# Patient Record
Sex: Female | Born: 1980 | Race: White | Hispanic: No | Marital: Married | Smoking: Former smoker
Health system: Southern US, Community
[De-identification: ages and names within clinical notes are randomized; demographics above are authoritative.]

## PROBLEM LIST (undated history)

## (undated) DIAGNOSIS — E669 Obesity, unspecified: Secondary | ICD-10-CM

## (undated) DIAGNOSIS — M797 Fibromyalgia: Secondary | ICD-10-CM

## (undated) DIAGNOSIS — M109 Gout, unspecified: Secondary | ICD-10-CM

## (undated) DIAGNOSIS — F419 Anxiety disorder, unspecified: Secondary | ICD-10-CM

## (undated) DIAGNOSIS — D509 Iron deficiency anemia, unspecified: Secondary | ICD-10-CM

## (undated) DIAGNOSIS — E039 Hypothyroidism, unspecified: Secondary | ICD-10-CM

## (undated) DIAGNOSIS — Z22322 Carrier or suspected carrier of Methicillin resistant Staphylococcus aureus: Secondary | ICD-10-CM

## (undated) DIAGNOSIS — M545 Low back pain, unspecified: Secondary | ICD-10-CM

## (undated) DIAGNOSIS — D6862 Lupus anticoagulant syndrome: Secondary | ICD-10-CM

## (undated) DIAGNOSIS — E559 Vitamin D deficiency, unspecified: Secondary | ICD-10-CM

## (undated) DIAGNOSIS — F319 Bipolar disorder, unspecified: Secondary | ICD-10-CM

## (undated) DIAGNOSIS — F429 Obsessive-compulsive disorder, unspecified: Secondary | ICD-10-CM

## (undated) DIAGNOSIS — F4310 Post-traumatic stress disorder, unspecified: Secondary | ICD-10-CM

## (undated) DIAGNOSIS — K219 Gastro-esophageal reflux disease without esophagitis: Secondary | ICD-10-CM

## (undated) DIAGNOSIS — I10 Essential (primary) hypertension: Secondary | ICD-10-CM

## (undated) DIAGNOSIS — N185 Chronic kidney disease, stage 5: Secondary | ICD-10-CM

## (undated) DIAGNOSIS — Z87898 Personal history of other specified conditions: Secondary | ICD-10-CM

## (undated) DIAGNOSIS — G43909 Migraine, unspecified, not intractable, without status migrainosus: Secondary | ICD-10-CM

## (undated) DIAGNOSIS — E282 Polycystic ovarian syndrome: Secondary | ICD-10-CM

## (undated) HISTORY — PX: BARIATRIC SURGERY: SHX1103

## (undated) HISTORY — DX: Gastro-esophageal reflux disease without esophagitis: K21.9

## (undated) HISTORY — DX: Obsessive-compulsive disorder, unspecified: F42.9

## (undated) HISTORY — DX: Anxiety disorder, unspecified: F41.9

## (undated) HISTORY — DX: Personal history of other specified conditions: Z87.898

## (undated) HISTORY — PX: OTHER SURGICAL HISTORY: SHX169

## (undated) HISTORY — DX: Low back pain: M54.5

## (undated) HISTORY — DX: Lupus anticoagulant syndrome: D68.62

## (undated) HISTORY — DX: Essential (primary) hypertension: I10

## (undated) HISTORY — DX: Low back pain, unspecified: M54.50

## (undated) HISTORY — DX: Carrier or suspected carrier of methicillin resistant Staphylococcus aureus: Z22.322

## (undated) HISTORY — DX: Post-traumatic stress disorder, unspecified: F43.10

## (undated) HISTORY — DX: Bipolar disorder, unspecified: F31.9

## (undated) HISTORY — DX: Migraine, unspecified, not intractable, without status migrainosus: G43.909

## (undated) HISTORY — DX: Vitamin D deficiency, unspecified: E55.9

## (undated) HISTORY — DX: Chronic kidney disease, stage 5: N18.5

## (undated) HISTORY — DX: Polycystic ovarian syndrome: E28.2

## (undated) HISTORY — DX: Obesity, unspecified: E66.9

## (undated) HISTORY — DX: Fibromyalgia: M79.7

## (undated) HISTORY — DX: Gout, unspecified: M10.9

## (undated) HISTORY — PX: AV FISTULA PLACEMENT: SHX1204

## (undated) HISTORY — DX: Iron deficiency anemia, unspecified: D50.9

## (undated) HISTORY — DX: Hypothyroidism, unspecified: E03.9

## (undated) HISTORY — PX: RENAL BIOPSY: SHX156

---

## 2002-03-05 DIAGNOSIS — I10 Essential (primary) hypertension: Secondary | ICD-10-CM | POA: Insufficient documentation

## 2009-07-16 DIAGNOSIS — F419 Anxiety disorder, unspecified: Secondary | ICD-10-CM | POA: Insufficient documentation

## 2009-07-16 DIAGNOSIS — J45909 Unspecified asthma, uncomplicated: Secondary | ICD-10-CM | POA: Insufficient documentation

## 2009-07-16 DIAGNOSIS — F32A Depression, unspecified: Secondary | ICD-10-CM | POA: Insufficient documentation

## 2011-04-12 DIAGNOSIS — N96 Recurrent pregnancy loss: Secondary | ICD-10-CM | POA: Insufficient documentation

## 2012-01-30 DIAGNOSIS — R768 Other specified abnormal immunological findings in serum: Secondary | ICD-10-CM | POA: Insufficient documentation

## 2012-01-30 DIAGNOSIS — R760 Raised antibody titer: Secondary | ICD-10-CM | POA: Insufficient documentation

## 2016-05-03 DIAGNOSIS — N028 Recurrent and persistent hematuria with other morphologic changes: Secondary | ICD-10-CM | POA: Insufficient documentation

## 2016-05-03 DIAGNOSIS — R809 Proteinuria, unspecified: Secondary | ICD-10-CM | POA: Insufficient documentation

## 2017-01-26 DIAGNOSIS — I770 Arteriovenous fistula, acquired: Secondary | ICD-10-CM | POA: Insufficient documentation

## 2017-12-15 ENCOUNTER — Ambulatory Visit: Payer: Managed Care, Other (non HMO) | Admitting: Family

## 2017-12-15 ENCOUNTER — Encounter: Payer: Self-pay | Admitting: Family

## 2017-12-15 VITALS — BP 120/86 | HR 79 | Temp 97.0°F | Ht 64.0 in | Wt 214.6 lb

## 2017-12-15 DIAGNOSIS — E282 Polycystic ovarian syndrome: Secondary | ICD-10-CM

## 2017-12-15 DIAGNOSIS — D509 Iron deficiency anemia, unspecified: Secondary | ICD-10-CM

## 2017-12-15 DIAGNOSIS — M797 Fibromyalgia: Secondary | ICD-10-CM

## 2017-12-15 DIAGNOSIS — N185 Chronic kidney disease, stage 5: Secondary | ICD-10-CM

## 2017-12-15 DIAGNOSIS — Z9884 Bariatric surgery status: Secondary | ICD-10-CM | POA: Insufficient documentation

## 2017-12-15 DIAGNOSIS — G40909 Epilepsy, unspecified, not intractable, without status epilepticus: Secondary | ICD-10-CM | POA: Insufficient documentation

## 2017-12-15 DIAGNOSIS — M545 Low back pain, unspecified: Secondary | ICD-10-CM | POA: Insufficient documentation

## 2017-12-15 DIAGNOSIS — F3181 Bipolar II disorder: Secondary | ICD-10-CM

## 2017-12-15 DIAGNOSIS — M1A39X0 Chronic gout due to renal impairment, multiple sites, without tophus (tophi): Secondary | ICD-10-CM

## 2017-12-15 DIAGNOSIS — E559 Vitamin D deficiency, unspecified: Secondary | ICD-10-CM

## 2017-12-15 DIAGNOSIS — I2510 Atherosclerotic heart disease of native coronary artery without angina pectoris: Secondary | ICD-10-CM

## 2017-12-15 DIAGNOSIS — E039 Hypothyroidism, unspecified: Secondary | ICD-10-CM

## 2017-12-15 DIAGNOSIS — G8929 Other chronic pain: Secondary | ICD-10-CM

## 2017-12-15 DIAGNOSIS — R55 Syncope and collapse: Secondary | ICD-10-CM

## 2017-12-15 DIAGNOSIS — I10 Essential (primary) hypertension: Secondary | ICD-10-CM

## 2017-12-15 DIAGNOSIS — F5101 Primary insomnia: Secondary | ICD-10-CM

## 2017-12-15 DIAGNOSIS — G4700 Insomnia, unspecified: Secondary | ICD-10-CM | POA: Insufficient documentation

## 2017-12-15 DIAGNOSIS — K219 Gastro-esophageal reflux disease without esophagitis: Secondary | ICD-10-CM | POA: Diagnosis not present

## 2017-12-15 DIAGNOSIS — F429 Obsessive-compulsive disorder, unspecified: Secondary | ICD-10-CM

## 2017-12-15 DIAGNOSIS — M109 Gout, unspecified: Secondary | ICD-10-CM | POA: Insufficient documentation

## 2017-12-15 DIAGNOSIS — F4310 Post-traumatic stress disorder, unspecified: Secondary | ICD-10-CM

## 2017-12-15 DIAGNOSIS — Z8739 Personal history of other diseases of the musculoskeletal system and connective tissue: Secondary | ICD-10-CM | POA: Insufficient documentation

## 2017-12-15 DIAGNOSIS — E538 Deficiency of other specified B group vitamins: Secondary | ICD-10-CM

## 2017-12-15 NOTE — Patient Instructions (Signed)
Chronic Kidney Disease, Adult Chronic kidney disease (CKD) happens when the kidneys are damaged during a time of 3 or more months. The kidneys are two organs that do many important jobs in the body. These jobs include:  Removing wastes and extra fluids from the blood.  Making hormones that maintain the amount of fluid in your tissues and blood vessels.  Making sure that the body has the right amount of fluids and chemicals.  Most of the time, this condition does not go away, but it can usually be controlled. Steps must be taken to slow down the kidney damage or stop it from getting worse. Otherwise, the kidneys may stop working. Follow these instructions at home:  Follow your diet as told by your doctor. You may need to avoid alcohol, salty foods (sodium), and foods that are high in potassium, calcium, and protein.  Take over-the-counter and prescription medicines only as told by your doctor. Do not take any new medicines unless your doctor says you can do that. These include vitamins and minerals. ? Medicines and nutritional supplements can make kidney damage worse. ? Your doctor may need to change how much medicine you take.  Do not use any tobacco products. These include cigarettes, chewing tobacco, and e-cigarettes. If you need help quitting, ask your doctor.  Keep all follow-up visits as told by your doctor. This is important.  Check your blood pressure. Tell your doctor if there are changes to your blood pressure.  Get to a healthy weight. Stay at that weight. If you need help with this, ask your doctor.  Start or continue an exercise plan. Try to exercise at least 30 minutes a day, 5 days a week.  Stay up-to-date with your shots (immunizations) as told by your doctor. Contact a doctor if:  Your symptoms get worse.  You have new symptoms. Get help right away if:  You have symptoms of end-stage kidney disease. These include: ? Headaches. ? Skin that is darker or lighter  than normal. ? Numbness in your hands or feet. ? Easy bruising. ? Having hiccups often. ? Chest pain. ? Shortness of breath. ? Stopping of menstrual periods in women.  You have a fever.  You are making very little pee (urine).  You have pain or bleeding when you pee (urinate). This information is not intended to replace advice given to you by your health care provider. Make sure you discuss any questions you have with your health care provider. Document Released: 12/21/2009 Document Revised: 03/03/2016 Document Reviewed: 05/25/2012 Elsevier Interactive Patient Education  2017 Elsevier Inc.  

## 2017-12-15 NOTE — Progress Notes (Signed)
Subjective:    Patient ID: Erica Burns, female    DOB: [birth year], 37 y.o.   MRN: [id number]  Pt presents to the office today to establish care. PT recently moved to this area about two weeks from [place]. We do not have her records from her previous PCP. Pt states she has several chronic problems: has Anxiety; Depression; Hypertension; Vitamin D deficiency; CAD (coronary artery disease); Iron deficiency anemia; Insomnia; Hypothyroidism; Low back pain; GERD (gastroesophageal reflux disease); Hx of bariatric surgery; Bipolar 2 disorder (Erica Burns); CKD (chronic kidney disease), stage V (Erica Burns); PCOS (polycystic ovarian syndrome); PTSD (post-traumatic stress disorder); Fibromyalgia; Seizure disorder (Erica Burns); OCD (obsessive compulsive disorder); Gout; and Hx of degenerative disc disease on their problem list..  Pt is currently not seeing any specialists at this time and requesting several referrals today. She has seen a Cardiologists, [employer], Nephrologists, and Neurologists,  Rheumatologists, and a Holiday representative in [place] before moving.   PT had an AV fistula placed December 12 2016, but never needed dialysis. States she had bariatric surgery last year and states her "kidneys stabilized after losing weight".   Hypertension  This is a chronic problem. The current episode started more than 1 year ago. The problem has been resolved since onset. The problem is controlled. Associated symptoms include malaise/fatigue. Pertinent negatives include no chest pain, headaches, peripheral edema or shortness of breath. Risk factors for coronary artery disease include dyslipidemia, obesity and sedentary lifestyle. The current treatment provides moderate improvement. Hypertensive end-organ damage includes kidney disease and CAD/MI. Identifiable causes of hypertension include a thyroid problem.  Depression         This is a chronic problem.  The current episode started more than 1 year ago.   The onset quality is gradual.    The problem occurs intermittently.  The problem has been waxing and waning since onset.  Associated symptoms include fatigue, helplessness, hopelessness, irritable, restlessness, decreased interest and sad.  Associated symptoms include no headaches.  Compliance with treatment is good.  Risk factors include major life event.   Past medical history includes thyroid problem.   Thyroid Problem  Presents for follow-up visit. Symptoms include constipation and fatigue. Patient reports no diarrhea or dry skin. The symptoms have been stable.  Seizures   This is a chronic problem. The current episode started more than 1 week ago. The problem has been gradually worsening. Pertinent negatives include no headaches, no chest pain and no diarrhea.  Anemia  Presents for follow-up visit. Symptoms include malaise/fatigue.  Back Pain  This is a chronic problem. The current episode started more than 1 year ago. The problem occurs intermittently. The problem has been waxing and waning since onset. The quality of the pain is described as aching. The pain is at a severity of 5/10. The pain is moderate. Pertinent negatives include no chest pain, dysuria, headaches or leg pain. She has tried analgesics and NSAIDs for the symptoms. The treatment provided mild relief.      Review of Systems  Constitutional: Positive for fatigue and malaise/fatigue.  Respiratory: Negative for shortness of breath.   Cardiovascular: Negative for chest pain.  Gastrointestinal: Positive for constipation. Negative for diarrhea.  Genitourinary: Negative for dysuria.  Musculoskeletal: Positive for back pain.  Neurological: Positive for seizures. Negative for headaches.  Psychiatric/Behavioral: Positive for depression.  All other systems reviewed and are negative.  Family History  Problem Relation Age of Onset  . Hypertension Mother   . Hypothyroidism Mother   . Hypertension Father   .  Hyperthyroidism Father     Social History    Socioeconomic History  . Marital status: Married    Spouse name: None  . Number of children: None  . Years of education: None  . Highest education level: None  Social Needs  . Financial resource strain: None  . Food insecurity - worry: None  . Food insecurity - inability: None  . Transportation needs - medical: None  . Transportation needs - non-medical: None  Occupational History  . None  Tobacco Use  . Smoking status: Current Every Day Smoker    Types: Cigarettes  . Smokeless tobacco: Never Used  Substance and Sexual Activity  . Alcohol use: No    Frequency: Never  . Drug use: No  . Sexual activity: None  Other Topics Concern  . None  Social History Narrative  . None        Objective:   Physical Exam  Constitutional: She is oriented to person, place, and time. She appears well-developed and well-nourished. She is irritable. No distress.  HENT:  Head: Normocephalic and atraumatic.  Right Ear: External ear normal.  Left Ear: External ear normal.  Nose: Nose normal.  Mouth/Throat: Oropharynx is clear and moist.  Eyes: Pupils are equal, round, and reactive to light.  Neck: Normal range of motion. Neck supple. No thyromegaly present.  Cardiovascular: Normal rate, regular rhythm, normal heart sounds and intact distal pulses.  No murmur heard. Pulmonary/Chest: Effort normal and breath sounds normal. No respiratory distress. She has no wheezes.  Abdominal: Soft. Bowel sounds are normal. She exhibits no distension. There is no tenderness.  Musculoskeletal: Normal range of motion. She exhibits no edema or tenderness.  Neurological: She is alert and oriented to person, place, and time.  Skin: Skin is warm and dry.  Psychiatric: She has a normal mood and affect. Her behavior is normal. Judgment and thought content normal.  Vitals reviewed.    BP 120/86   Pulse 79   Temp (!) 97 F (36.1 C) (Oral)   Ht 5' 4"  (1.626 m)   Wt 214 lb 9.6 oz (97.3 kg)   BMI 36.84 kg/m       Assessment & Plan:  1. Vitamin D deficiency - CMP14+EGFR - VITAMIN D 25 Hydroxy (Vit-D Deficiency, Fractures)  2. Coronary artery disease involving native heart without angina pectoris, unspecified vessel or lesion type - Ambulatory referral to Cardiology - CMP14+EGFR  3. Iron deficiency anemia, unspecified iron deficiency anemia type - Anemia Profile B - CMP14+EGFR  4. Primary insomnia - Ambulatory referral to Psychiatry - CMP14+EGFR  5. Hypothyroidism, unspecified type - CMP14+EGFR - TSH  6. Chronic bilateral low back pain without sciatica - CMP14+EGFR  7. Gastroesophageal reflux disease, esophagitis presence not specified - CMP14+EGFR  8. Hx of bariatric surgery - CMP14+EGFR  9. Bipolar 2 disorder ([place])  - Ambulatory referral to Psychiatry - CMP14+EGFR  10. Essential hypertension - CMP14+EGFR  11. CKD (chronic kidney disease), stage V ([place]) - Ambulatory referral to Nephrology - CMP14+EGFR  12. PCOS (polycystic ovarian syndrome)  - CMP14+EGFR  13. PTSD (post-traumatic stress disorder) - Ambulatory referral to Psychiatry - CMP14+EGFR  14. Fibromyalgia  - Ambulatory referral to Psychiatry - Ambulatory referral to Neurology - CMP14+EGFR  15. Seizure disorder [facility]) - Ambulatory referral to Neurology - CMP14+EGFR  16. Obsessive-compulsive disorder, unspecified type  - Ambulatory referral to Psychiatry - CMP14+EGFR  17. Chronic gout due to renal impairment of multiple sites without tophus  - CMP14+EGFR  18. Hx of degenerative  disc disease  - CMP14+EGFR  19. Syncope, unspecified syncope type - Ambulatory referral to Neurology - CMP14+EGFR  20. Vitamin B 12 deficiency    Continue all meds Labs pending Health Maintenance reviewed- Pt states she had her Pap last year, but can not recall name of place. Will call us with name so we can get records faxed and scanned into chart.  Diet and exercise encouraged RTO 3 months   Evelina Dun, FNP

## 2017-12-16 LAB — VITAMIN D 25 HYDROXY (VIT D DEFICIENCY, FRACTURES): VIT D 25 HYDROXY: 43.3 ng/mL (ref 30.0–100.0)

## 2017-12-16 LAB — CMP14+EGFR
A/G RATIO: 1.4 (ref 1.2–2.2)
ALBUMIN: 3.7 g/dL (ref 3.5–5.5)
ALK PHOS: 70 IU/L (ref 39–117)
ALT: 14 IU/L (ref 0–32)
AST: 10 IU/L (ref 0–40)
BILIRUBIN TOTAL: 0.3 mg/dL (ref 0.0–1.2)
BUN / CREAT RATIO: 10 (ref 9–23)
BUN: 40 mg/dL — ABNORMAL HIGH (ref 6–20)
CO2: 23 mmol/L (ref 20–29)
Calcium: 9.2 mg/dL (ref 8.7–10.2)
Chloride: 107 mmol/L — ABNORMAL HIGH (ref 96–106)
Creatinine, Ser: 3.86 mg/dL (ref 0.57–1.00)
GFR calc Af Amer: 16 mL/min/{1.73_m2} — ABNORMAL LOW (ref >59)
GFR calc non Af Amer: 14 mL/min/{1.73_m2} — ABNORMAL LOW (ref >59)
GLOBULIN, TOTAL: 2.7 g/dL (ref 1.5–4.5)
Glucose: 79 mg/dL (ref 65–99)
POTASSIUM: 5 mmol/L (ref 3.5–5.2)
Sodium: 145 mmol/L — ABNORMAL HIGH (ref 134–144)
Total Protein: 6.4 g/dL (ref 6.0–8.5)

## 2017-12-16 LAB — ANEMIA PROFILE B
BASOS: 1 %
Basophils Absolute: 0.1 10*3/uL (ref 0.0–0.2)
EOS (ABSOLUTE): 0.4 10*3/uL (ref 0.0–0.4)
EOS: 5 %
FERRITIN: 27 ng/mL (ref 15–150)
Folate: 5.1 ng/mL (ref >3.0)
HEMATOCRIT: 43.9 % (ref 34.0–46.6)
HEMOGLOBIN: 14.1 g/dL (ref 11.1–15.9)
IMMATURE GRANS (ABS): 0 10*3/uL (ref 0.0–0.1)
IRON SATURATION: 22 % (ref 15–55)
Immature Granulocytes: 0 %
Iron: 69 ug/dL (ref 27–159)
LYMPHS ABS: 2.3 10*3/uL (ref 0.7–3.1)
Lymphs: 31 %
MCH: 28.9 pg (ref 26.6–33.0)
MCHC: 32.1 g/dL (ref 31.5–35.7)
MCV: 90 fL (ref 79–97)
MONOS ABS: 0.5 10*3/uL (ref 0.1–0.9)
Monocytes: 6 %
Neutrophils Absolute: 4.2 10*3/uL (ref 1.4–7.0)
Neutrophils: 57 %
Platelets: 257 10*3/uL (ref 150–379)
RBC: 4.88 x10E6/uL (ref 3.77–5.28)
RDW: 15.8 % — ABNORMAL HIGH (ref 12.3–15.4)
Retic Ct Pct: 1.2 % (ref 0.6–2.6)
Total Iron Binding Capacity: 311 ug/dL (ref 250–450)
UIBC: 242 ug/dL (ref 131–425)
Vitamin B-12: 685 pg/mL (ref 232–1245)
WBC: 7.4 10*3/uL (ref 3.4–10.8)

## 2017-12-16 LAB — TSH: TSH: 4.5 u[IU]/mL (ref 0.450–4.500)

## 2017-12-26 ENCOUNTER — Ambulatory Visit: Payer: Managed Care, Other (non HMO) | Admitting: Family

## 2018-01-04 ENCOUNTER — Ambulatory Visit: Payer: Managed Care, Other (non HMO) | Admitting: Cardiology

## 2018-01-18 ENCOUNTER — Encounter: Payer: Self-pay | Admitting: Nurse Practitioner

## 2018-01-18 ENCOUNTER — Ambulatory Visit: Payer: Managed Care, Other (non HMO) | Admitting: Nurse Practitioner

## 2018-01-18 VITALS — BP 125/78 | HR 100 | Temp 97.8°F | Ht 64.0 in | Wt 215.0 lb

## 2018-01-18 DIAGNOSIS — J40 Bronchitis, not specified as acute or chronic: Secondary | ICD-10-CM

## 2018-01-18 MED ORDER — HYDROCODONE-HOMATROPINE 5-1.5 MG/5ML PO SYRP: 5.0000 mL | ORAL_SOLUTION | Freq: Four times a day (QID) | ORAL | 0 refills | Status: DC | PRN

## 2018-01-18 MED ORDER — AZITHROMYCIN 250 MG PO TABS: ORAL_TABLET | ORAL | 0 refills | Status: DC

## 2018-01-18 MED ORDER — ALBUTEROL SULFATE HFA 108 (90 BASE) MCG/ACT IN AERS: 2.0000 | INHALATION_SPRAY | Freq: Four times a day (QID) | RESPIRATORY_TRACT | 0 refills | Status: DC | PRN

## 2018-01-18 MED ORDER — PREDNISONE 10 MG (21) PO TBPK: ORAL_TABLET | ORAL | 0 refills | Status: DC

## 2018-01-18 NOTE — Progress Notes (Signed)
   Subjective:    Patient ID: Erica Burns, female    DOB: [birth year], 37 y.o.   MRN: [id number]  HPI patient comes in c/o cough for over 2 weeks. Has gotten worse. Hurts to cough. Difficult to take a deep breath.    Review of Systems  Constitutional: Negative for chills and fever.  HENT: Positive for congestion. Negative for ear pain, sinus pressure, sinus pain, sore throat and trouble swallowing.   Respiratory: Positive for cough and shortness of breath.   Cardiovascular: Negative.   Neurological: Negative.   Psychiatric/Behavioral: Negative.   All other systems reviewed and are negative.      Objective:   Physical Exam  Constitutional: She is oriented to person, place, and time. She appears well-developed and well-nourished. No distress.  HENT:  Right Ear: Hearing, tympanic membrane, external ear and ear canal normal.  Left Ear: Hearing, tympanic membrane, external ear and ear canal normal.  Nose: Mucosal edema and rhinorrhea present. Right sinus exhibits no maxillary sinus tenderness and no frontal sinus tenderness. Left sinus exhibits no maxillary sinus tenderness and no frontal sinus tenderness.  Mouth/Throat: Uvula is midline, oropharynx is clear and moist and mucous membranes are normal.  Neck: Normal range of motion. Neck supple.  Cardiovascular: Normal rate and regular rhythm.  Pulmonary/Chest: Effort normal. No respiratory distress. She has wheezes (exp wheezes throughout with deep wet cough).  Neurological: She is alert and oriented to person, place, and time.  Skin: Skin is warm.  Psychiatric: She has a normal mood and affect. Her behavior is normal. Judgment and thought content normal.   BP 125/78   Pulse 100   Temp 97.8 F (36.6 C) (Oral)   Ht 5\' 4"  (1.626 m)   Wt 215 lb (97.5 kg)   BMI 36.90 kg/m         Assessment & Plan:   1. Bronchitis    Smoking cessation encouraged Meds ordered this encounter  Medications  . azithromycin (ZITHROMAX Z-PAK) 250  MG tablet    Sig: As directed    Dispense:  6 tablet    Refill:  0    Order Specific Question:   Supervising Provider    Answer:   VINCENT, CAROL L [[id number]]  . HYDROcodone-homatropine (HYCODAN) 5-1.5 MG/5ML syrup    Sig: Take 5 mLs by mouth every 6 (six) hours as needed for cough.    Dispense:  120 mL    Refill:  0    Order Specific Question:   Supervising Provider    Answer:   VINCENT, CAROL L [[id number]]  . predniSONE (STERAPRED UNI-PAK 21 TAB) 10 MG (21) TBPK tablet    Sig: As directed x 6 days    Dispense:  21 tablet    Refill:  0    Order Specific Question:   Supervising Provider    Answer:   VINCENT, CAROL L [[id number]]  . albuterol (PROVENTIL HFA;VENTOLIN HFA) 108 (90 Base) MCG/ACT inhaler    Sig: Inhale 2 puffs into the lungs every 6 (six) hours as needed for wheezing or shortness of breath.    Dispense:  1 Inhaler    Refill:  0    Order Specific Question:   Supervising Provider    Answer:   Johna SheriffVINCENT, CAROL L [[id number]]   Rest RTO prn  Mary-Margaret Daphine DeutscherMartin, FNP

## 2018-01-18 NOTE — Addendum Note (Signed)
Addended by: Bennie PieriniMARTIN, MARY-MARGARET on: 01/18/2018 10:56 AM   Modules accepted: Orders

## 2018-01-18 NOTE — Patient Instructions (Signed)

## 2018-01-23 ENCOUNTER — Encounter: Payer: Self-pay | Admitting: Cardiology

## 2018-01-23 NOTE — Progress Notes (Signed)
Cardiology Office Note  Date: 01/24/2018   ID: Erica Burns, DOB 11-09-1980, MRN [id number]  PCP: Junie SpencerHawks, Christy A, FNP  Consulting Cardiologist: Nona DellSamuel Estoria Geary, MD   Chief Complaint  Patient presents with  . Cardiac evaluation    History of Present Illness: Erica Burns is a 37 y.o. female referred for cardiology consultation by Ms. Hawks NP due to reported history of CAD. I reviewed available records and updated her chart.  She is here today with significant other.  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  She tells me that she had some type of "cardiac event" in December 2018, was hospitalized in [place] with chest tightness following an episode of intense lower back pain.  She recalls being told that she did not have a heart attack, and since that time has not had any recurring symptoms.  She also states that she had a cardiac workup prior to bariatric surgery in 2017 and does not recall any specific concerns.  I reviewed her medications.  She states that she has been on Cardizem CD and clonidine for blood pressure control.  She states that her blood pressure fluctuates from time to time.  She does not report any exertional chest pain, palpitations, orthopnea, dizziness, or syncope.  She does have chronic intermittent leg swelling, particularly when she is sedentary.  I personally reviewed her ECG today which shows normal sinus rhythm.  She is in the process of establishing with a nephrologist, has known history of CKD stage 5 with AV fistula in place, although has not had to undergo hemodialysis.   Past Medical History:  Diagnosis Date  . Anxiety   . Bipolar disorder (HCC)   . CKD (chronic kidney disease) stage 5, GFR less than 15 ml/min (HCC)   . Essential hypertension   . Fibromyalgia   . GERD (gastroesophageal reflux disease)   . Gout   . History of seizures    Single episode reportedly secondary to Imitrex  . Hypothyroidism   .  Iron deficiency anemia   . Low back pain   . Lupus anticoagulant syndrome (HCC)   . Migraine headache   . MRSA carrier   . Obesity   . OCD (obsessive compulsive disorder)   . PCOS (polycystic ovarian syndrome)   . PTSD (post-traumatic stress disorder)   . Vitamin D deficiency     Past Surgical History:  Procedure Laterality Date  . Arthoscopic knee surgery    . AV FISTULA PLACEMENT    . BARIATRIC SURGERY    . Cyst removal    . RENAL BIOPSY      Current Outpatient Medications  Medication Sig Dispense Refill  . albuterol (PROVENTIL HFA;VENTOLIN HFA) 108 (90 Base) MCG/ACT inhaler Inhale 2 puffs into the lungs every 6 (six) hours as needed for wheezing or shortness of breath. 1 Inhaler 0  . Cholecalciferol (HM VITAMIN D3) 4000 units CAPS Take by mouth.    . cloNIDine (CATAPRES) 0.1 MG tablet     . diltiazem (CARDIZEM CD) 180 MG 24 hr capsule     . ferrous sulfate 325 (65 FE) MG tablet Take by mouth.    Marland Kitchen. HYDROcodone-homatropine (HYCODAN) 5-1.5 MG/5ML syrup Take 5 mLs by mouth every 6 (six) hours as needed for cough. 120 mL 0  . hydrOXYzine (ATARAX/VISTARIL) 25 MG tablet     . levothyroxine (SYNTHROID, LEVOTHROID) 50 MCG tablet     . lidocaine (LIDODERM) 5 %     .  pantoprazole (PROTONIX) 40 MG tablet     . promethazine (PHENERGAN) 25 MG tablet TAKE 1 TABLET BY MOUTH TWICE A DAY AS NEEDED FOR MOTION SICKNESS  0  . traZODone (DESYREL) 100 MG tablet     . ziprasidone (GEODON) 20 MG capsule Take 20 mg by mouth 2 (two) times daily.  0   No current facility-administered medications for this visit.    Allergies:  Imitrex [sumatriptan]; Latex; and Nsaids   Social History: The patient  reports that she has been smoking cigarettes.  She has been smoking about 0.25 packs per day. She has never used smokeless tobacco. She reports that she does not drink alcohol or use drugs.   Family History: The patient's family history includes Hyperlipidemia in her father and mother; Hypertension in her  father and mother; Kidney Stones in her father.   ROS:  Please see the history of present illness. Otherwise, complete review of systems is positive for mild intermittent leg swelling.  All other systems are reviewed and negative.   Physical Exam: VS:  BP (!) 140/98   Pulse 91   Ht 5\' 4"  (1.626 m)   Wt 220 lb (99.8 kg)   SpO2 97%   BMI 37.76 kg/m , BMI Body mass index is 37.76 kg/m.  Wt Readings from Last 3 Encounters:  01/24/18 220 lb (99.8 kg)  01/18/18 215 lb (97.5 kg)  12/15/17 214 lb 9.6 oz (97.3 kg)    General: Patient appears comfortable at rest. HEENT: Conjunctiva and lids normal, oropharynx clear. Neck: Supple, no elevated JVP or carotid bruits, no thyromegaly. Lungs: Clear to auscultation, nonlabored breathing at rest. Cardiac: Regular rate and rhythm, no S3 or significant systolic murmur, no pericardial rub. Abdomen: Soft, nontender, bowel sounds present. Extremities: No pitting edema, distal pulses 2+.  AV fistula left forearm with thrill. Skin: Warm and dry. Musculoskeletal: No kyphosis. Neuropsychiatric: Alert and oriented x3, affect grossly appropriate.  ECG: There is no old tracing available for comparison.  Recent Labwork: 12/15/2017: ALT 14; AST 10; BUN 40; Creatinine, Ser 3.86; Hemoglobin 14.1; Platelets 257; Potassium 5.0; Sodium 145; TSH 4.500   Assessment and Plan:  1.  Patient presents for cardiac evaluation with reported history of chest pain and a hospital stay in [place] back in December 2018.  At this time I do not have records to review objective workup, but she is under the impression that she did not have a heart attack or any other major abnormalities uncovered at that point.  Her ECG today is normal.  She is not reporting recurring chest pain.  Plan at this time is to try and obtain records from cardiac workup prior to bariatric surgery in 2017, and also hospital stay from 2018.  2.  Essential hypertension, currently on clonidine and Cardizem  CD.  She has recently established with [facility] follow-up.  3.  CKD stage 5, recent creatinine 3.86.  She has a left forearm AV fistula that has not been accessed as yet.  She has not undergone hemodialysis.  Currently in the process of establishing with a nephrologist.  4.  Tobacco abuse.  Current medicines were reviewed with the patient today.   Orders Placed This Encounter  Procedures  . EKG 12-Lead    Disposition: Obtain outside records.  Signed, Jonelle Sidle, MD, [facility] 01/24/2018 9:23 AM    [facility] at [facility] [address] [place], [place], [place] [address] Phone: [phone]; Fax: [phone]

## 2018-01-24 ENCOUNTER — Encounter: Payer: Self-pay | Admitting: *Deleted

## 2018-01-24 ENCOUNTER — Ambulatory Visit: Payer: Managed Care, Other (non HMO) | Admitting: Cardiology

## 2018-01-24 ENCOUNTER — Encounter: Payer: Self-pay | Admitting: Cardiology

## 2018-01-24 VITALS — BP 140/98 | HR 91 | Ht 64.0 in | Wt 220.0 lb

## 2018-01-24 DIAGNOSIS — N185 Chronic kidney disease, stage 5: Secondary | ICD-10-CM

## 2018-01-24 DIAGNOSIS — Z720 Tobacco use: Secondary | ICD-10-CM

## 2018-01-24 DIAGNOSIS — I10 Essential (primary) hypertension: Secondary | ICD-10-CM | POA: Diagnosis not present

## 2018-01-24 DIAGNOSIS — Z87898 Personal history of other specified conditions: Secondary | ICD-10-CM | POA: Diagnosis not present

## 2018-01-24 NOTE — Patient Instructions (Signed)
Your physician recommends that you schedule a follow-up appointment in: TO BE DETERMINED WITH DR MCDOWELL   Your physician recommends that you continue on your current medications as directed. Please refer to the Current Medication list given to you today.  Thank you for choosing [facility]!!

## 2018-01-24 NOTE — Progress Notes (Signed)
Records received from [facility] in [place] regarding hospital stay late November to early January 2018.  She presented at that time with what was described as epigastric and chest discomfort as well as shortness of breath.  Morphine improved symptoms.  Troponin levels were negative for ACS.  She underwent a Lexiscan Myoview on December 1 which did not demonstrate any significant ST segment changes and revealed no evidence of ischemia with normal LVEF.  Echocardiogram also done at that time reported LVEF 55 to 60%, normal right ventricular contraction, mild mitral regurgitation, mild tricuspid regurgitation, and PASP 35-40 mmHg.  ECG showed sinus rhythm with decreased R wave progression and low voltage in the precordial leads.

## 2018-01-30 ENCOUNTER — Ambulatory Visit (HOSPITAL_COMMUNITY): Payer: Self-pay | Admitting: Psychiatry

## 2018-02-05 ENCOUNTER — Encounter: Payer: Self-pay | Admitting: Family

## 2018-02-05 ENCOUNTER — Ambulatory Visit: Payer: Managed Care, Other (non HMO) | Admitting: Family

## 2018-02-05 VITALS — BP 146/106 | HR 85 | Temp 97.7°F | Ht 64.0 in | Wt 220.4 lb

## 2018-02-05 DIAGNOSIS — F332 Major depressive disorder, recurrent severe without psychotic features: Secondary | ICD-10-CM | POA: Diagnosis not present

## 2018-02-05 DIAGNOSIS — F3181 Bipolar II disorder: Secondary | ICD-10-CM | POA: Diagnosis not present

## 2018-02-05 DIAGNOSIS — F429 Obsessive-compulsive disorder, unspecified: Secondary | ICD-10-CM

## 2018-02-05 DIAGNOSIS — F419 Anxiety disorder, unspecified: Secondary | ICD-10-CM

## 2018-02-05 MED ORDER — ZIPRASIDONE HCL 40 MG PO CAPS: 40.0000 mg | ORAL_CAPSULE | Freq: Two times a day (BID) | ORAL | 1 refills | Status: DC

## 2018-02-05 NOTE — Progress Notes (Signed)
Subjective:    Patient ID: Erica Burns, female    DOB: 06-23-1981, 37 y.o.   MRN: [id number]  PT presents to the office today for increased GAD, Depression, PTSD, OCD, and Bipoloar 2. PT had psychiatrists in [place], but has not seen behavorial health since. She has an appt on 02/22/18. States she see's a Quarry manager weekly but feels like this is not helping at this time.   Depression         This is a chronic problem.  The current episode started more than 1 year ago.   The onset quality is gradual.   The problem occurs constantly.  Associated symptoms include decreased concentration, helplessness, hopelessness, irritable, restlessness, decreased interest and sad.  Associated symptoms include no suicidal ideas.     The symptoms are aggravated by family issues.  Past medical history includes anxiety.   Anxiety  Presents for follow-up visit. Symptoms include decreased concentration, excessive worry, irritability, palpitations and restlessness. Patient reports no suicidal ideas. Symptoms occur most days. The severity of symptoms is severe. The quality of sleep is good.        Review of Systems  Constitutional: Positive for irritability.  Cardiovascular: Positive for palpitations.  Psychiatric/Behavioral: Positive for decreased concentration and depression. Negative for suicidal ideas.  All other systems reviewed and are negative.      Objective:   Physical Exam  Constitutional: She appears well-developed and well-nourished. She is irritable. No distress.  HENT:  Head: Normocephalic and atraumatic.  Right Ear: External ear normal.  Mouth/Throat: Oropharynx is clear and moist.  Eyes: Pupils are equal, round, and reactive to light.  Neck: Normal range of motion. Neck supple. No thyromegaly present.  Cardiovascular: Normal rate, regular rhythm, normal heart sounds and intact distal pulses.  No murmur heard. Pulmonary/Chest: Effort normal. No respiratory distress. She has wheezes.    Abdominal: Soft. Bowel sounds are normal. She exhibits no distension. There is no tenderness.  Musculoskeletal: Normal range of motion. She exhibits no edema or tenderness.  Neurological: She is alert. She has normal reflexes.  Skin: Skin is warm and dry.  Psychiatric: Thought content normal. Her mood appears anxious. She is hyperactive. She expresses inappropriate judgment. She expresses no suicidal ideation. She is inattentive.  Vitals reviewed.    BP (!) 146/106   Pulse 85   Temp 97.7 F (36.5 C) (Oral)   Ht  (1.626 m)   Wt 220 lb 6.4 oz (100 kg)   BMI 37.83 kg/m      Assessment & Plan:  1. Anxiety - ziprasidone (GEODON) 40 MG capsule; Take 1 capsule (40 mg total) by mouth 2 (two) times daily with a meal.  Dispense: 60 capsule; Refill: 1  2. Severe episode of recurrent major depressive disorder, without psychotic features (HCC) - ziprasidone (GEODON) 40 MG capsule; Take 1 capsule (40 mg total) by mouth 2 (two) times daily with a meal.  Dispense: 60 capsule; Refill: 1  3. Bipolar 2 disorder (HCC)  - ziprasidone (GEODON) 40 MG capsule; Take 1 capsule (40 mg total) by mouth 2 (two) times daily with a meal.  Dispense: 60 capsule; Refill: 1  4. Obsessive-compulsive disorder, unspecified type - ziprasidone (GEODON) 40 MG capsule; Take 1 capsule (40 mg total) by mouth 2 (two) times daily with a meal.  Dispense: 60 capsule; Refill: 1  Will increase Geodon to BID instead of daily Called [facility] and sent in to Urgent Care for patient to be seen today. Her referral to [facility]  was pushed back to next month If patient has any SI or HI to go to ED   Jannifer Rodney, FNP

## 2018-02-05 NOTE — Patient Instructions (Signed)
Bipolar 2 Disorder Bipolar 2 disorder is a mental health disorder in which a person has episodes of emotional highs (mania) and lows (depression). Bipolar 2 is different from other bipolar disorders because the manic episodes are not as high and do not last as long. This is called hypomania. People with bipolar 2 disorder usually go back and forth between hypomanic and depressive episodes. What are the causes? The cause of this condition is not known. What increases the risk? The following factors may make you more likely to develop this condition:  Having a family member with the disorder.  An imbalance of certain chemicals in the brain (neurotransmitters).  Stress, such as a death, illness, or financial problems.  Certain conditions that affect the brain or spinal cord (neurologic conditions).  Brain injury (trauma).  Having another mental health disorder, such as: ? Obsessive compulsive disorder. ? Schizophrenia.  What are the signs or symptoms? Symptoms of hypomania include:  Very high self-esteem or self-confidence.  Decreased need for sleep.  Unusual talkativeness or feeling a need to keep talking. Speech may be very fast. It may seem like you cannot stop talking.  Racing thoughts or constant talking, with quick shifts between topics that may or may not be related (flight of ideas).  Decreased ability to focus or concentrate.  Increased purposeful activity, such as work, studies, or social activity.  Increased nonproductive activity. This could be pacing, squirming and fidgeting, or finger and toe tapping.  Impulsive behavior and poor judgment. This may result in high-risk activities, such as having unprotected sex or spending a lot of money.  Symptoms of depression include:  Feeling sad, hopeless, or helpless.  Frequent or uncontrollable crying.  Lack of feeling or caring about anything.  Sleeping too much.  Moving more slowly than usual.  Not being able to  enjoy things you used to enjoy.  Desire to be alone all the time.  Feeling guilty or worthless.  Lack of energy or motivation.  Trouble concentrating or remembering.  Trouble making decisions.  Increased appetite.  Thoughts of death or desire to harm yourself.  How is this diagnosed? To diagnose bipolar 2 disorder, your health care provider may ask about your:  Emotional episodes.  Medical history.  Alcohol and drug use. This includes prescription medicines. Certain medical conditions and substances can cause symptoms that seem like bipolar disorder (secondary bipolar disorder).  How is this treated? Bipolar 2 disorder is a long-term (chronic) illness. It is best controlled with ongoing (continuous) treatment rather than being treated only when symptoms occur. Treatment may include:  Psychotherapy. Some forms of talk therapy, such as cognitive-behavioral therapy (CBT), can provide support, education, and guidance.  Coping strategies, such as journaling or relaxation exercises. Relaxation exercises include: ? Yoga. ? Meditation. ? Deep breathing.  Lifestyle changes, such as: ? Limiting alcohol and drug use. ? Exercising regularly. ? Getting plenty of sleep. ? Making healthy eating choices.  Medicine. Medicine can be prescribed by a health care provider who specializes in treating mental disorders (psychiatrist). ? Medicines called mood stabilizers are usually prescribed. ? If symptoms occur even while taking a mood stabilizer, other medicines may be added.  A combination of medicine, talk therapy, and coping methods is the best way to treat this condition. Follow these instructions at home: Activity  Return to your normal activities as told by your health care provider.  Find activities that you enjoy, and make time to do them.  Exercise regularly as told by your health   care provider. Lifestyle  Limit alcohol intake to no more than 1 drink a day for nonpregnant  women and 2 drinks a day for men. One drink equals 12 oz of beer, 5 oz of wine, or 1 oz of hard liquor.  Follow a set schedule for eating and sleeping.  Eat a balanced diet that includes fresh fruits and vegetables, whole grains, low-fat dairy, and lean meats.  Get at least 7-8 hours of sleep each night. General instructions  Take over-the-counter and prescription medicines only as told by your health care provider.  Think about joining a support group. Your health care provider may be able to recommend a support group.  Talk with your family and loved ones about your treatment goals and how they can help.  Keep all follow-up visits as told by your health care provider. This is important. Where to find more information: For more information about bipolar 2 disorder, visit the following websites:  National Alliance on Mental Illness: www.nami.org  U.S. National Institute of Mental Health: www.nimh.nih.gov  Contact a health care provider if:  Your symptoms get worse.  You have side effects from your medicine, and they get worse.  You have trouble sleeping.  You have trouble doing daily activities.  You feel unsafe in your surroundings.  You are dealing with substance abuse. Get help right away if:  You have new symptoms.  You have thoughts about harming yourself or others.  You harm yourself. Summary  Bipolar 2 disorder is a mental health disorder in which a person has episodes of hypomania and depression.  Bipolar 2 is best treated through a combination of medicines, talk therapy, and coping strategies.  Talk with your family and loved ones about your treatment goals and how they can help. This information is not intended to replace advice given to you by your health care provider. Make sure you discuss any questions you have with your health care provider. Document Released: 11/01/2016 Document Revised: 11/01/2016 Document Reviewed: 11/01/2016 Elsevier Interactive  Patient Education  2018 Elsevier Inc.  

## 2018-02-14 ENCOUNTER — Other Ambulatory Visit: Payer: Self-pay | Admitting: Nurse Practitioner

## 2018-02-14 DIAGNOSIS — J40 Bronchitis, not specified as acute or chronic: Secondary | ICD-10-CM

## 2018-02-20 ENCOUNTER — Ambulatory Visit: Payer: Managed Care, Other (non HMO) | Admitting: Neurology

## 2018-02-21 ENCOUNTER — Encounter: Payer: Self-pay | Admitting: Neurology

## 2018-02-22 ENCOUNTER — Encounter

## 2018-02-22 ENCOUNTER — Encounter (HOSPITAL_COMMUNITY): Payer: Self-pay | Admitting: Psychiatry

## 2018-02-22 ENCOUNTER — Ambulatory Visit (INDEPENDENT_AMBULATORY_CARE_PROVIDER_SITE_OTHER): Payer: 59 | Admitting: Psychiatry

## 2018-02-22 VITALS — BP 112/78 | HR 90 | Ht 64.0 in | Wt 213.0 lb

## 2018-02-22 DIAGNOSIS — F419 Anxiety disorder, unspecified: Secondary | ICD-10-CM

## 2018-02-22 DIAGNOSIS — F332 Major depressive disorder, recurrent severe without psychotic features: Secondary | ICD-10-CM

## 2018-02-22 DIAGNOSIS — F4310 Post-traumatic stress disorder, unspecified: Secondary | ICD-10-CM

## 2018-02-22 DIAGNOSIS — F3181 Bipolar II disorder: Secondary | ICD-10-CM

## 2018-02-22 DIAGNOSIS — F429 Obsessive-compulsive disorder, unspecified: Secondary | ICD-10-CM

## 2018-02-22 MED ORDER — BUSPIRONE HCL 5 MG PO TABS: 5.0000 mg | ORAL_TABLET | Freq: Two times a day (BID) | ORAL | 0 refills | Status: DC

## 2018-02-22 MED ORDER — TRAZODONE HCL 100 MG PO TABS: ORAL_TABLET | ORAL | 0 refills | Status: DC

## 2018-02-22 MED ORDER — ZIPRASIDONE HCL 60 MG PO CAPS: 60.0000 mg | ORAL_CAPSULE | Freq: Every day | ORAL | 0 refills | Status: DC
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
## 2018-02-23 ENCOUNTER — Ambulatory Visit (INDEPENDENT_AMBULATORY_CARE_PROVIDER_SITE_OTHER): Payer: Managed Care, Other (non HMO) | Admitting: Nurse Practitioner

## 2018-02-23 ENCOUNTER — Encounter: Payer: Self-pay | Admitting: Nurse Practitioner

## 2018-02-23 ENCOUNTER — Ambulatory Visit (INDEPENDENT_AMBULATORY_CARE_PROVIDER_SITE_OTHER): Payer: Managed Care, Other (non HMO)

## 2018-02-23 VITALS — BP 130/88 | HR 89 | Temp 97.5°F | Ht 64.0 in | Wt 212.0 lb

## 2018-02-23 DIAGNOSIS — R05 Cough: Secondary | ICD-10-CM

## 2018-02-23 DIAGNOSIS — R059 Cough, unspecified: Secondary | ICD-10-CM

## 2018-02-23 DIAGNOSIS — J209 Acute bronchitis, unspecified: Secondary | ICD-10-CM

## 2018-02-23 MED ORDER — HYDROCODONE-HOMATROPINE 5-1.5 MG/5ML PO SYRP: 5.0000 mL | ORAL_SOLUTION | Freq: Four times a day (QID) | ORAL | 0 refills | Status: DC | PRN

## 2018-02-23 MED ORDER — AMOXICILLIN 875 MG PO TABS: 875.0000 mg | ORAL_TABLET | Freq: Two times a day (BID) | ORAL | 0 refills | Status: DC

## 2018-02-23 MED ORDER — PREDNISONE 10 MG (21) PO TBPK: ORAL_TABLET | ORAL | 0 refills | Status: DC

## 2018-02-23 MED ORDER — AZITHROMYCIN 250 MG PO TABS: ORAL_TABLET | ORAL | 0 refills | Status: DC

## 2018-02-23 NOTE — Progress Notes (Addendum)
   Subjective:    Patient ID: Erica Burns, female    DOB: 04-03-1981, 37 y.o.   MRN: [id number]   Chief Complaint: Cough   HPI Patient come sin today c/o cough that started 2 weeks. She has used OTC cough meds and used her albuterol inhaler. She is having SOB.     Review of Systems  Constitutional: Negative for chills and fever.  HENT: Positive for congestion. Negative for ear pain, rhinorrhea, sinus pressure, sinus pain, sore throat and trouble swallowing.   Respiratory: Positive for cough, shortness of breath and wheezing.   Cardiovascular: Negative.   Gastrointestinal: Negative.   Genitourinary: Negative.   Neurological: Negative.   Psychiatric/Behavioral: Negative.   All other systems reviewed and are negative.      Objective:   Physical Exam  Constitutional: She is oriented to person, place, and time. She appears well-developed and well-nourished. She appears distressed (mild).  HENT:  Right Ear: Hearing, tympanic membrane, external ear and ear canal normal.  Left Ear: Hearing, tympanic membrane, external ear and ear canal normal.  Nose: Mucosal edema and rhinorrhea present.  Mouth/Throat: Oropharynx is clear and moist and mucous membranes are normal.  Neck: Normal range of motion. Neck supple.  Pulmonary/Chest: Effort normal. She has wheezes (exp wheezes tyhroughout).  Deep raspy cough  Lymphadenopathy:    She has no cervical adenopathy.  Neurological: She is alert and oriented to person, place, and time. No cranial nerve deficit.  Skin: Skin is warm.  Psychiatric: She has a normal mood and affect. Her behavior is normal. Judgment and thought content normal.  Nursing note and vitals reviewed.   BP 130/88   Pulse 89   Temp (!) 97.5 F (36.4 C) (Oral)   Ht  (1.626 m)   Wt 212 lb (96.2 kg)   BMI 36.39 kg/m '  Chest xray- no pneumonia- mild bronchitic changes-Preliminary reading by Paulene Floor, FNP  [facility]     Assessment & Plan:  Erica Burns in today  with chief complaint of Cough   1. Cough - DG Chest 2 View; Future  2. Acute bronchitis, unspecified organism Force fluids Rest Sedation precautions with coughmeds Follow up prn - azithromycin (ZITHROMAX Z-PAK) 250 MG tablet; As directed  Dispense: 6 tablet; Refill: 0 - predniSONE (STERAPRED UNI-PAK 21 TAB) 10 MG (21) TBPK tablet; As directed x 6 days  Dispense: 21 tablet; Refill: 0 - HYDROcodone-homatropine (HYCODAN) 5-1.5 MG/5ML syrup; Take 5 mLs by mouth every 6 (six) hours as needed for cough.  Dispense: 120 mL; Refill: 0  Mary-Margaret Daphine Deutscher, FNP  D/c'd z pak- changed to amoxicillin 875 bid x 10 days #20

## 2018-02-23 NOTE — Patient Instructions (Signed)

## 2018-02-23 NOTE — Addendum Note (Signed)
Addended by: Bennie Pierini on: 02/23/2018 04:12 PM   Modules accepted: Orders

## 2018-02-28 ENCOUNTER — Other Ambulatory Visit: Payer: Self-pay | Admitting: *Deleted

## 2018-02-28 DIAGNOSIS — R059 Cough, unspecified: Secondary | ICD-10-CM

## 2018-02-28 DIAGNOSIS — R05 Cough: Secondary | ICD-10-CM

## 2018-03-01 ENCOUNTER — Telehealth: Payer: Self-pay | Admitting: Family

## 2018-03-02 NOTE — Telephone Encounter (Signed)
Letter sent with appointment date/time 

## 2018-03-06 ENCOUNTER — Telehealth: Payer: Self-pay | Admitting: Family

## 2018-03-06 NOTE — Telephone Encounter (Signed)
Discussed this with our Radiologists. She will call and get order changed. I do not want to cancel appointment and lose her appointment time.

## 2018-03-07 ENCOUNTER — Other Ambulatory Visit: Payer: Managed Care, Other (non HMO)

## 2018-03-18 ENCOUNTER — Other Ambulatory Visit (HOSPITAL_COMMUNITY): Payer: Self-pay | Admitting: Psychiatry

## 2018-03-18 DIAGNOSIS — F3181 Bipolar II disorder: Secondary | ICD-10-CM

## 2018-03-18 DIAGNOSIS — F419 Anxiety disorder, unspecified: Secondary | ICD-10-CM

## 2018-03-19 ENCOUNTER — Encounter: Payer: Self-pay | Admitting: Family

## 2018-03-19 ENCOUNTER — Ambulatory Visit (INDEPENDENT_AMBULATORY_CARE_PROVIDER_SITE_OTHER): Payer: Managed Care, Other (non HMO) | Admitting: Family

## 2018-03-19 VITALS — BP 109/78 | HR 80 | Temp 97.7°F | Ht 64.0 in | Wt 222.6 lb

## 2018-03-19 DIAGNOSIS — J45909 Unspecified asthma, uncomplicated: Secondary | ICD-10-CM

## 2018-03-19 DIAGNOSIS — I2510 Atherosclerotic heart disease of native coronary artery without angina pectoris: Secondary | ICD-10-CM | POA: Diagnosis not present

## 2018-03-19 DIAGNOSIS — I10 Essential (primary) hypertension: Secondary | ICD-10-CM | POA: Diagnosis not present

## 2018-03-19 DIAGNOSIS — K219 Gastro-esophageal reflux disease without esophagitis: Secondary | ICD-10-CM

## 2018-03-19 DIAGNOSIS — G40909 Epilepsy, unspecified, not intractable, without status epilepticus: Secondary | ICD-10-CM | POA: Diagnosis not present

## 2018-03-19 DIAGNOSIS — F419 Anxiety disorder, unspecified: Secondary | ICD-10-CM | POA: Diagnosis not present

## 2018-03-19 DIAGNOSIS — E039 Hypothyroidism, unspecified: Secondary | ICD-10-CM | POA: Diagnosis not present

## 2018-03-19 DIAGNOSIS — F4310 Post-traumatic stress disorder, unspecified: Secondary | ICD-10-CM

## 2018-03-19 DIAGNOSIS — N185 Chronic kidney disease, stage 5: Secondary | ICD-10-CM

## 2018-03-19 DIAGNOSIS — D509 Iron deficiency anemia, unspecified: Secondary | ICD-10-CM

## 2018-03-19 DIAGNOSIS — F332 Major depressive disorder, recurrent severe without psychotic features: Secondary | ICD-10-CM

## 2018-03-19 DIAGNOSIS — Z23 Encounter for immunization: Secondary | ICD-10-CM

## 2018-03-19 MED ORDER — PROMETHAZINE HCL 25 MG PO TABS: ORAL_TABLET | ORAL | 5 refills | Status: AC

## 2018-03-19 MED ORDER — LEVOTHYROXINE SODIUM 50 MCG PO TABS: 50.0000 ug | ORAL_TABLET | Freq: Every day | ORAL | 1 refills | Status: AC

## 2018-03-19 MED ORDER — CLONIDINE HCL 0.1 MG PO TABS: 0.1000 mg | ORAL_TABLET | Freq: Two times a day (BID) | ORAL | 3 refills | Status: AC

## 2018-03-19 MED ORDER — BECLOMETHASONE DIPROP HFA 40 MCG/ACT IN AERB: 2.0000 | INHALATION_SPRAY | Freq: Two times a day (BID) | RESPIRATORY_TRACT | 6 refills | Status: AC

## 2018-03-19 MED ORDER — DILTIAZEM HCL ER COATED BEADS 180 MG PO CP24: 180.0000 mg | ORAL_CAPSULE | Freq: Every day | ORAL | 1 refills | Status: AC

## 2018-03-19 MED ORDER — TRAZODONE HCL 100 MG PO TABS: ORAL_TABLET | ORAL | 0 refills | Status: DC

## 2018-03-19 NOTE — Progress Notes (Signed)
Subjective:    Patient ID: Erica Burns, female    DOB: [birth year], 37 y.o.   MRN: [id number]  Chief Complaint  Patient presents with  . Depression    three month recheck  . Hypertension  . Hypothyroidism   PT presents to the office today for chronic follow up. PT is followed by behavorial health monthly for  GAD, Depression, PTSD, OCD, and Bipoloar 2. And followed by nephrologists every 6 weeks for CKD. She is scheduled for neurologists for seizure.     Hypertension  This is a chronic problem. The current episode started more than 1 year ago. The problem has been resolved since onset. The problem is controlled. Associated symptoms include anxiety and malaise/fatigue. Pertinent negatives include no headaches, peripheral edema or shortness of breath. Risk factors for coronary artery disease include obesity and sedentary lifestyle. The current treatment provides moderate improvement. Hypertensive end-organ damage includes CAD/MI. There is no history of CVA or heart failure. Identifiable causes of hypertension include a thyroid problem.  Anxiety  Presents for follow-up visit. Symptoms include depressed mood, excessive worry, insomnia, nervous/anxious behavior and restlessness. Patient reports no shortness of breath. Symptoms occur constantly. The severity of symptoms is moderate.   Her past medical history is significant for anemia and asthma.  Depression         Associated symptoms include fatigue, insomnia and restlessness.  Associated symptoms include no headaches.  Past medical history includes thyroid problem and anxiety.   Gastroesophageal Reflux  She complains of heartburn and wheezing. She reports no belching or no coughing. This is a chronic problem. The current episode started more than 1 year ago. The problem occurs occasionally. The problem has been waxing and waning. The symptoms are aggravated by certain foods. Associated symptoms include fatigue. She has tried an antacid for the  symptoms. The treatment provided moderate relief.  Thyroid Problem  Presents for follow-up visit. Symptoms include anxiety, constipation, depressed mood, dry skin, fatigue and weight gain. Patient reports no diarrhea. The symptoms have been worsening. There is no history of heart failure.  Asthma  She complains of wheezing. There is no cough, frequent throat clearing or shortness of breath. This is a chronic problem. The current episode started more than 1 year ago. The problem occurs intermittently. The problem has been waxing and waning. Associated symptoms include heartburn and malaise/fatigue. Pertinent negatives include no headaches. She reports moderate improvement on treatment. Her past medical history is significant for asthma.  Anemia  Presents for follow-up visit. Symptoms include malaise/fatigue. There has been no bruising/bleeding easily. There is no history of heart failure.      Review of Systems  Constitutional: Positive for fatigue, malaise/fatigue and weight gain.  Respiratory: Positive for wheezing. Negative for cough and shortness of breath.   Gastrointestinal: Positive for constipation and heartburn. Negative for diarrhea.  Neurological: Negative for headaches.  Hematological: Does not bruise/bleed easily.  Psychiatric/Behavioral: Positive for depression. The patient is nervous/anxious and has insomnia.   All other systems reviewed and are negative.      Objective:   Physical Exam  Constitutional: She is oriented to person, place, and time. She appears well-developed and well-nourished. No distress.  HENT:  Head: Normocephalic and atraumatic.  Right Ear: External ear normal.  Left Ear: External ear normal.  Mouth/Throat: Oropharynx is clear and moist.  Eyes: Pupils are equal, round, and reactive to light.  Neck: Normal range of motion. Neck supple. No thyromegaly present.  Cardiovascular: Normal rate, regular rhythm, normal heart  sounds and intact distal pulses.    No murmur heard. Pulmonary/Chest: Effort normal. No respiratory distress. She has wheezes.  Abdominal: Soft. Bowel sounds are normal. She exhibits no distension. There is no tenderness.  Musculoskeletal: Normal range of motion. She exhibits no edema or tenderness.  Neurological: She is alert and oriented to person, place, and time. She has normal reflexes. No cranial nerve deficit.  Skin: Skin is warm and dry.  Psychiatric: She has a normal mood and affect. Her behavior is normal. Judgment and thought content normal.  Vitals reviewed.     BP 109/78   Pulse 80   Temp 97.7 F (36.5 C) (Oral)   Ht 5' 4"  (1.626 m)   Wt 222 lb 9.6 oz (101 kg)   BMI 38.21 kg/m      Assessment & Plan:  Erica Burns comes in today with chief complaint of Depression (three month recheck); Hypertension; and Hypothyroidism   Diagnosis and orders addressed:  1. Essential hypertension - CMP14+EGFR - diltiazem (CARDIZEM CD) 180 MG 24 hr capsule; Take 1 capsule (180 mg total) by mouth daily.  Dispense: 90 capsule; Refill: 1 - cloNIDine (CATAPRES) 0.1 MG tablet; Take 1 tablet (0.1 mg total) by mouth 2 (two) times daily.  Dispense: 180 tablet; Refill: 3  2. Moderate asthma without complication, unspecified whether persistent - CMP14+EGFR - beclomethasone (QVAR REDIHALER) 40 MCG/ACT inhaler; Inhale 2 puffs into the lungs 2 (two) times daily.  Dispense: 10.6 g; Refill: 6  3. Gastroesophageal reflux disease, esophagitis presence not specified - CMP14+EGFR  4. Hypothyroidism, unspecified type - CMP14+EGFR - TSH - levothyroxine (SYNTHROID, LEVOTHROID) 50 MCG tablet; Take 1 tablet (50 mcg total) by mouth daily before breakfast.  Dispense: 90 tablet; Refill: 1  5. Iron deficiency anemia, unspecified iron deficiency anemia type - Anemia Profile B - CMP14+EGFR  6. Severe episode of recurrent major depressive disorder, without psychotic features ([place]) - CMP14+EGFR  7. Anxiety - CMP14+EGFR  8. CKD  (chronic kidney disease), stage V (HCC) - CMP14+EGFR  9. Seizure disorder (HCC) - CMP14+EGFR  10. 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 PTSD (post-traumatic stress disorder) - traZODone (DESYREL) 100 MG tablet; Take 1/2 to one tab as needed for insomnia  Dispense: 30 tablet; Refill: 0   Labs pending Health Maintenance reviewed- TDAP given today and pt to schedule next appt as PAP Diet and exercise encouraged  Follow up plan:  Keep all appts with [facility] 4 months    Evelina Dun, [employer] [facility]

## 2018-03-19 NOTE — Patient Instructions (Signed)

## 2018-03-19 NOTE — Addendum Note (Signed)
Addended by: Almeta MonasSTONE, JANIE M on: 03/19/2018 04:13 PM   Modules accepted: Orders

## 2018-03-20 ENCOUNTER — Encounter (HOSPITAL_COMMUNITY): Payer: Self-pay | Admitting: Emergency Medicine

## 2018-03-20 ENCOUNTER — Emergency Department (HOSPITAL_COMMUNITY)
Admission: EM | Admit: 2018-03-20 | Discharge: 2018-03-20 | Disposition: A | Discharge status: Home / Self Care | Payer: Managed Care, Other (non HMO) | Attending: Emergency Medicine | Admitting: Emergency Medicine

## 2018-03-20 ENCOUNTER — Other Ambulatory Visit: Payer: Self-pay

## 2018-03-20 ENCOUNTER — Ambulatory Visit (HOSPITAL_COMMUNITY)
Admission: RE | Admit: 2018-03-20 | Discharge: 2018-03-20 | Disposition: A | Discharge status: Home / Self Care | Payer: Managed Care, Other (non HMO) | Source: Ambulatory Visit | Attending: Nurse Practitioner | Admitting: Nurse Practitioner

## 2018-03-20 DIAGNOSIS — I120 Hypertensive chronic kidney disease with stage 5 chronic kidney disease or end stage renal disease: Secondary | ICD-10-CM | POA: Diagnosis not present

## 2018-03-20 DIAGNOSIS — R918 Other nonspecific abnormal finding of lung field: Secondary | ICD-10-CM

## 2018-03-20 DIAGNOSIS — R05 Cough: Secondary | ICD-10-CM

## 2018-03-20 DIAGNOSIS — N185 Chronic kidney disease, stage 5: Secondary | ICD-10-CM | POA: Insufficient documentation

## 2018-03-20 DIAGNOSIS — Z87891 Personal history of nicotine dependence: Secondary | ICD-10-CM | POA: Diagnosis not present

## 2018-03-20 DIAGNOSIS — R059 Cough, unspecified: Secondary | ICD-10-CM

## 2018-03-20 DIAGNOSIS — Z91040 Latex allergy status: Secondary | ICD-10-CM | POA: Insufficient documentation

## 2018-03-20 DIAGNOSIS — N189 Chronic kidney disease, unspecified: Secondary | ICD-10-CM

## 2018-03-20 DIAGNOSIS — Z79899 Other long term (current) drug therapy: Secondary | ICD-10-CM | POA: Insufficient documentation

## 2018-03-20 DIAGNOSIS — E039 Hypothyroidism, unspecified: Secondary | ICD-10-CM | POA: Diagnosis not present

## 2018-03-20 DIAGNOSIS — R799 Abnormal finding of blood chemistry, unspecified: Secondary | ICD-10-CM | POA: Diagnosis present

## 2018-03-20 LAB — LIPID PANEL
CHOL/HDL RATIO: 3.2 ratio (ref 0.0–4.4)
CHOLESTEROL TOTAL: 168 mg/dL (ref 100–199)
HDL: 52 mg/dL (ref >39)
LDL CALC: 97 mg/dL (ref 0–99)
Triglycerides: 94 mg/dL (ref 0–149)
VLDL CHOLESTEROL CAL: 19 mg/dL (ref 5–40)

## 2018-03-20 LAB — ANEMIA PROFILE B
BASOS: 1 %
Basophils Absolute: 0.1 10*3/uL (ref 0.0–0.2)
EOS (ABSOLUTE): 0.5 10*3/uL — AB (ref 0.0–0.4)
Eos: 6 %
FERRITIN: 46 ng/mL (ref 15–150)
Folate: 6.6 ng/mL (ref >3.0)
HEMATOCRIT: 41.4 % (ref 34.0–46.6)
Hemoglobin: 13.8 g/dL (ref 11.1–15.9)
IMMATURE GRANS (ABS): 0 10*3/uL (ref 0.0–0.1)
Immature Granulocytes: 0 %
Iron Saturation: 27 % (ref 15–55)
Iron: 71 ug/dL (ref 27–159)
LYMPHS: 31 %
Lymphocytes Absolute: 2.7 10*3/uL (ref 0.7–3.1)
MCH: 30.5 pg (ref 26.6–33.0)
MCHC: 33.3 g/dL (ref 31.5–35.7)
MCV: 92 fL (ref 79–97)
Monocytes Absolute: 0.7 10*3/uL (ref 0.1–0.9)
Monocytes: 8 %
NEUTROS ABS: 4.8 10*3/uL (ref 1.4–7.0)
Neutrophils: 54 %
Platelets: 233 10*3/uL (ref 150–450)
RBC: 4.52 x10E6/uL (ref 3.77–5.28)
RDW: 14.1 % (ref 12.3–15.4)
Retic Ct Pct: 1.3 % (ref 0.6–2.6)
Total Iron Binding Capacity: 266 ug/dL (ref 250–450)
UIBC: 195 ug/dL (ref 131–425)
VITAMIN B 12: 812 pg/mL (ref 232–1245)
WBC: 8.8 10*3/uL (ref 3.4–10.8)

## 2018-03-20 LAB — TSH: TSH: 4.11 u[IU]/mL (ref 0.450–4.500)

## 2018-03-20 LAB — CMP14+EGFR
ALBUMIN: 3.8 g/dL (ref 3.5–5.5)
ALT: 10 IU/L (ref 0–32)
AST: 10 IU/L (ref 0–40)
Albumin/Globulin Ratio: 1.7 (ref 1.2–2.2)
Alkaline Phosphatase: 62 IU/L (ref 39–117)
BUN / CREAT RATIO: 10 (ref 9–23)
BUN: 43 mg/dL — AB (ref 6–20)
Bilirubin Total: 0.3 mg/dL (ref 0.0–1.2)
CALCIUM: 9.6 mg/dL (ref 8.7–10.2)
CO2: 23 mmol/L (ref 20–29)
CREATININE: 4.29 mg/dL — AB (ref 0.57–1.00)
Chloride: 106 mmol/L (ref 96–106)
GFR, EST AFRICAN AMERICAN: 14 mL/min/{1.73_m2} — AB (ref >59)
GFR, EST NON AFRICAN AMERICAN: 12 mL/min/{1.73_m2} — AB (ref >59)
GLOBULIN, TOTAL: 2.2 g/dL (ref 1.5–4.5)
Glucose: 96 mg/dL (ref 65–99)
Potassium: 5.7 mmol/L — ABNORMAL HIGH (ref 3.5–5.2)
SODIUM: 145 mmol/L — AB (ref 134–144)
TOTAL PROTEIN: 6 g/dL (ref 6.0–8.5)

## 2018-03-20 LAB — BASIC METABOLIC PANEL
Anion gap: 9 (ref 5–15)
BUN: 42 mg/dL — ABNORMAL HIGH (ref 6–20)
CALCIUM: 9 mg/dL (ref 8.9–10.3)
CO2: 25 mmol/L (ref 22–32)
Chloride: 107 mmol/L (ref 101–111)
Creatinine, Ser: 4.05 mg/dL — ABNORMAL HIGH (ref 0.44–1.00)
GFR, EST AFRICAN AMERICAN: 15 mL/min — AB (ref >60)
GFR, EST NON AFRICAN AMERICAN: 13 mL/min — AB (ref >60)
Glucose, Bld: 79 mg/dL (ref 65–99)
Potassium: 4.8 mmol/L (ref 3.5–5.1)
Sodium: 141 mmol/L (ref 135–145)

## 2018-03-20 LAB — CBC
HEMATOCRIT: 42.8 % (ref 36.0–46.0)
HEMOGLOBIN: 13.7 g/dL (ref 12.0–15.0)
MCH: 29.9 pg (ref 26.0–34.0)
MCHC: 32 g/dL (ref 30.0–36.0)
MCV: 93.4 fL (ref 78.0–100.0)
Platelets: 259 10*3/uL (ref 150–400)
RBC: 4.58 MIL/uL (ref 3.87–5.11)
RDW: 13 % (ref 11.5–15.5)
WBC: 7.1 10*3/uL (ref 4.0–10.5)

## 2018-03-20 NOTE — ED Notes (Signed)
Patient given discharge instruction, verbalized understand. Patient ambulatory out of the department.  

## 2018-03-20 NOTE — ED Triage Notes (Signed)
Patient states PCP advised her to come to ER due to high potassium and high creatinine from yesterday. Patient complaining of weakness x 2 days.

## 2018-03-20 NOTE — ED Provider Notes (Signed)
[facility] EMERGENCY DEPARTMENT Provider Note   CSN: [id number] Arrival date & time: 03/20/18  1340     History   Chief Complaint Chief Complaint  Patient presents with  . Abnormal Lab    HPI Erica Burns is a 37 y.o. female.  HPI Patient has a history of renal failure.  Patient states this is related to hypertension and NSAID use when she was younger.  Patient went to her primary care doctor and had routine lab tests yesterday.  She was called by her doctor's office today telling her to get evaluated because her potassium was elevated.  Patient tried to call her nephrologist and was instructed to come to the ED when she could not be seen.  Patient denies any vomiting or diarrhea.  No med changes.  She denies any specific complaints other than some mild generalized weakness. Past Medical History:  Diagnosis Date  . Anxiety   . Bipolar disorder (HCC)   . CKD (chronic kidney disease) stage 5, GFR less than 15 ml/min (HCC)   . Essential hypertension   . Fibromyalgia   . GERD (gastroesophageal reflux disease)   . Gout   . History of seizures    Single episode reportedly secondary to Imitrex  . Hypothyroidism   . Iron deficiency anemia   . Low back pain   . Lupus anticoagulant syndrome (HCC)   . Migraine headache   . MRSA carrier   . Obesity   . OCD (obsessive compulsive disorder)   . PCOS (polycystic ovarian syndrome)   . PTSD (post-traumatic stress disorder)   . Vitamin D deficiency     Patient Active Problem List   Diagnosis Date Noted  . Vitamin D deficiency 12/15/2017  . CAD (coronary artery disease) 12/15/2017  . Iron deficiency anemia 12/15/2017  . Insomnia 12/15/2017  . Hypothyroidism 12/15/2017  . Low back pain 12/15/2017  . GERD (gastroesophageal reflux disease) 12/15/2017  . Hx of bariatric surgery 12/15/2017  . Bipolar 2 disorder (HCC) 12/15/2017  . CKD (chronic kidney disease), stage V (HCC) 12/15/2017  . PCOS (polycystic ovarian syndrome) 12/15/2017    . PTSD (post-traumatic stress disorder) 12/15/2017  . Fibromyalgia 12/15/2017  . Seizure disorder (HCC) 12/15/2017  . OCD (obsessive compulsive disorder) 12/15/2017  . Gout 12/15/2017  . Hx of degenerative disc disease 12/15/2017  . Vitamin B 12 deficiency 12/15/2017  . 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 Morbid (severe) obesity due to excess calories (HCC) 05/03/2016  . Proteinuria 05/03/2016  . 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 Lupus anticoagulant positive 01/30/2012  . 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 Anxiety 07/16/2009  . Depression 07/16/2009  . Asthma 07/16/2009  . Hypertension 03/05/2002    Past Surgical History:  Procedure Laterality Date  . Arthoscopic knee surgery    . AV FISTULA PLACEMENT    . BARIATRIC SURGERY    . Cyst removal    . RENAL BIOPSY       OB History   None      Home Medications    Prior to Admission medications   Medication Sig Start Date End Date Taking? Authorizing Provider  albuterol (PROVENTIL HFA;VENTOLIN HFA) 108 (90 Base) MCG/ACT inhaler TAKE 2 PUFFS BY MOUTH EVERY 6 HOURS AS NEEDED FOR WHEEZE OR SHORTNESS OF BREATH 02/14/18  Yes Hawks, Christy A, FNP  busPIRone (BUSPAR) 5 MG tablet Take 1 tablet (5 mg total) by mouth  2 (two) times daily. 02/22/18 02/22/19 Yes Arfeen, Phillips GroutSyed T, MD  cloNIDine (CATAPRES) 0.1 MG tablet Take 1 tablet (0.1 mg total) by mouth 2 (two) times daily. 03/19/18  Yes Hawks, Christy A, FNP  diltiazem (CARDIZEM CD) 180 MG 24 hr capsule Take 1 capsule (180 mg total) by mouth daily. 03/19/18  Yes Hawks, Christy A, FNP  ferrous sulfate 325 (65 FE) MG tablet Take 325 mg by mouth daily.    Yes [provider]  levothyroxine (SYNTHROID, LEVOTHROID) 50 MCG tablet Take 1 tablet (50 mcg total) by mouth daily before breakfast. 03/19/18  Yes Hawks, Christy A, FNP  lidocaine (LIDODERM) 5 % Place  1 patch onto the skin daily.  11/12/17  Yes [provider]  promethazine (PHENERGAN) 25 MG tablet TAKE 1 TABLET BY MOUTH TWICE A DAY AS NEEDED FOR MOTION SICKNESS 03/19/18  Yes Jannifer RodneyHawks, Christy A, FNP  traZODone (DESYREL) 100 MG tablet Take 1/2 to one tab as needed for insomnia 03/19/18  Yes Hawks, Christy A, FNP  ziprasidone (GEODON) 60 MG capsule Take 1 capsule (60 mg total) by mouth at bedtime. 02/22/18  Yes Arfeen, Phillips GroutSyed T, MD  beclomethasone (QVAR REDIHALER) 40 MCG/ACT inhaler Inhale 2 puffs into the lungs 2 (two) times daily. Patient not taking: Reported on 03/20/2018 03/19/18   Junie SpencerHawks, Christy A, FNP    Family History Family History  Problem Relation Age of Onset  . Hypertension Mother   . Hyperlipidemia Mother   . Hypertension Father   . Hyperlipidemia Father   . Kidney Stones Father     Social History Social History   Tobacco Use  . Smoking status: Former Smoker    Packs/day: 0.25    Types: Cigarettes  . Smokeless tobacco: Never Used  Substance Use Topics  . Alcohol use: No    Frequency: Never  . Drug use: No     Allergies   Eggs or egg-derived products; Imitrex [sumatriptan]; Latex; Nsaids; Soybean oil; and Whey   Review of Systems Review of Systems  All other systems reviewed and are negative.    Physical Exam Updated Vital Signs BP (!) 118/92   Pulse 71   Temp 98.9 F (37.2 C) (Oral)   Resp 13   Ht 1.626 m (5\' 4" )   Wt 99.8 kg (220 lb)   LMP 03/10/2018   SpO2 100%   BMI 37.76 kg/m   Physical Exam  Constitutional: She appears well-developed and well-nourished. No distress.  HENT:  Head: Normocephalic and atraumatic.  Right Ear: External ear normal.  Left Ear: External ear normal.  Eyes: Conjunctivae are normal. Right eye exhibits no discharge. Left eye exhibits no discharge. No scleral icterus.  Neck: Neck supple. No tracheal deviation present.  Cardiovascular: Normal rate, regular rhythm and intact distal pulses.  Palpable thrill left wrist  from AV fistula  Pulmonary/Chest: Effort normal and breath sounds normal. No stridor. No respiratory distress. She has no wheezes. She has no rales.  Abdominal: Soft. Bowel sounds are normal. She exhibits no distension. There is no tenderness. There is no rebound and no guarding.  Musculoskeletal: She exhibits no edema or tenderness.  Neurological: She is alert. She has normal strength. No cranial nerve deficit (no facial droop, extraocular movements intact, no slurred speech) or sensory deficit. She exhibits normal muscle tone. She displays no seizure activity. Coordination normal.  Skin: Skin is warm and dry. No rash noted.  Psychiatric: She has a normal mood and affect.  Nursing note and vitals reviewed.  ED Treatments / Results  Labs (all labs ordered are listed, but only abnormal results are displayed) Labs Reviewed  BASIC METABOLIC PANEL - Abnormal; Notable for the following components:      Result Value   BUN 42 (*)    Creatinine, Ser 4.05 (*)    GFR calc non Af Amer 13 (*)    GFR calc Af Amer 15 (*)    All other components within normal limits  CBC    EKG EKG Interpretation  Date/Time:  Tuesday March 20 2018 14:44:13 EDT Ventricular Rate:  74 PR Interval:    QRS Duration: 86 QT Interval:  382 QTC Calculation: 424 R Axis:   7 Text Interpretation:  Sinus rhythm No old tracing to compare Confirmed by Linwood Dibbles [phone]) on 03/20/2018 2:48:29 PM   Radiology No results found.  Procedures Procedures (including critical care time)  Medications Ordered in ED Medications - No data to display   Initial Impression / Assessment and Plan / ED Course  I have reviewed the triage vital signs and the nursing notes.  Pertinent labs & imaging results that were available during my care of the patient were reviewed by me and considered in my medical decision making (see chart for details).  Clinical Course as of Mar 20 1545  Tue Mar 20, 2018  1545 D.w Dr Kristian Covey. No changes  recommended at this time.  Follow up in the office.   [JK]    Clinical Course User Index [JK] Linwood Dibbles, MD   Patient was sent to the ED because of abnormal outpatient labs.  Patient has a known history of chronic renal failure.  She does have an AV fistula but has not started on dialysis yet.  Patient's potassium is normal today.  Her creatinine is also decreased from yesterday.  Patient is not on any diuretics.  Discussed this with Dr. Kristian Covey.  She can follow-up as an outpatient.  Final Clinical Impressions(s) / ED Diagnoses   Final diagnoses:  Chronic renal failure, unspecified CKD stage    ED Discharge Orders    None       Linwood Dibbles, MD 03/20/18 1547

## 2018-03-20 NOTE — ED Notes (Signed)
Pt has Out patient CT scheduled at 5pm.  EDP will discharge after talking with Nephrology, to allow pt to keep appointment.
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
## 2018-03-26 ENCOUNTER — Ambulatory Visit (INDEPENDENT_AMBULATORY_CARE_PROVIDER_SITE_OTHER): Payer: 59 | Admitting: Psychiatry

## 2018-03-26 ENCOUNTER — Encounter (HOSPITAL_COMMUNITY): Payer: Self-pay | Admitting: Psychiatry

## 2018-03-26 VITALS — BP 118/83 | HR 64 | Ht 64.0 in | Wt 223.0 lb

## 2018-03-26 DIAGNOSIS — F4310 Post-traumatic stress disorder, unspecified: Secondary | ICD-10-CM | POA: Diagnosis not present

## 2018-03-26 DIAGNOSIS — F3181 Bipolar II disorder: Secondary | ICD-10-CM

## 2018-03-26 DIAGNOSIS — F419 Anxiety disorder, unspecified: Secondary | ICD-10-CM | POA: Diagnosis not present

## 2018-03-26 MED ORDER — TRAZODONE HCL 100 MG PO TABS: 150.0000 mg | ORAL_TABLET | Freq: Every evening | ORAL | 0 refills | Status: DC | PRN

## 2018-03-26 MED ORDER — ZIPRASIDONE HCL 80 MG PO CAPS
80.0000 mg | ORAL_CAPSULE | Freq: Every day | ORAL | 0 refills | Status: DC
Start: 2018-03-26 — End: 2018-04-25

## 2018-03-26 MED ORDER — BUSPIRONE HCL 5 MG PO TABS: 5.0000 mg | ORAL_TABLET | Freq: Three times a day (TID) | ORAL | 0 refills | Status: DC

## 2018-03-26 NOTE — Patient Instructions (Signed)
1. Increase Geodon 80 mg at night (424 msec on 03/2018) 2. Increase buspar 5 mg three times a day 3. Increase Trazodone 150 mg as needed for anxiety  4. Return to clinic in one month for 30 mins 5. Referral to therapy

## 2018-03-29 ENCOUNTER — Ambulatory Visit (HOSPITAL_COMMUNITY): Payer: Self-pay | Admitting: Psychiatry
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
## 2018-04-23 ENCOUNTER — Other Ambulatory Visit: Payer: Managed Care, Other (non HMO)

## 2018-04-24 ENCOUNTER — Ambulatory Visit (INDEPENDENT_AMBULATORY_CARE_PROVIDER_SITE_OTHER): Payer: Managed Care, Other (non HMO)

## 2018-04-24 ENCOUNTER — Encounter: Payer: Self-pay | Admitting: Family

## 2018-04-24 ENCOUNTER — Ambulatory Visit (INDEPENDENT_AMBULATORY_CARE_PROVIDER_SITE_OTHER): Payer: Managed Care, Other (non HMO) | Admitting: Family

## 2018-04-24 VITALS — BP 138/92 | HR 88 | Temp 98.4°F | Ht 64.0 in | Wt 233.0 lb

## 2018-04-24 DIAGNOSIS — M10371 Gout due to renal impairment, right ankle and foot: Secondary | ICD-10-CM

## 2018-04-24 DIAGNOSIS — M79674 Pain in right toe(s): Secondary | ICD-10-CM | POA: Diagnosis not present

## 2018-04-24 DIAGNOSIS — N185 Chronic kidney disease, stage 5: Secondary | ICD-10-CM | POA: Diagnosis not present

## 2018-04-24 MED ORDER — TRAMADOL HCL 50 MG PO TABS: 50.0000 mg | ORAL_TABLET | Freq: Two times a day (BID) | ORAL | 0 refills | Status: DC | PRN

## 2018-04-24 NOTE — Patient Instructions (Signed)

## 2018-04-24 NOTE — Progress Notes (Signed)
   Subjective:    Patient ID: Erica Burns, female    DOB: March 28, 1981, 37 y.o.   MRN: [id number]  Chief Complaint  Patient presents with  . swollen great , right toe    Toe Pain   The incident occurred more than 1 week ago. There was no injury mechanism. Pain location: right great toe. The quality of the pain is described as aching. The pain is at a severity of 9/10. The pain is moderate. The pain has been constant since onset. Associated symptoms include an inability to bear weight. She reports no foreign bodies present. The symptoms are aggravated by weight bearing. She has tried acetaminophen (lidocaine patches) for the symptoms. The treatment provided mild relief.      Review of Systems  All other systems reviewed and are negative.      Objective:   Physical Exam  Constitutional: She is oriented to person, place, and time. She appears well-developed and well-nourished. No distress.  HENT:  Head: Normocephalic.  Eyes: Pupils are equal, round, and reactive to light.  Neck: Normal range of motion. Neck supple. No thyromegaly present.  Cardiovascular: Normal rate, regular rhythm, normal heart sounds and intact distal pulses.  No murmur heard. Pulmonary/Chest: Effort normal and breath sounds normal. No respiratory distress. She has no wheezes.  Abdominal: Soft. Bowel sounds are normal. She exhibits no distension. There is no tenderness.  Musculoskeletal: Normal range of motion. She exhibits tenderness (left great toe pain with pressure, ). She exhibits no edema.  Neurological: She is alert and oriented to person, place, and time. She has normal reflexes. No cranial nerve deficit.  Skin: Skin is warm and dry.  Psychiatric: She has a normal mood and affect. Her behavior is normal. Judgment and thought content normal.  Vitals reviewed.   BP (!) 138/92   Pulse 88   Temp 98.4 F (36.9 C) (Oral)   Ht _0  (1.626 m)   Wt 233 lb (105.7 kg)   BMI 39.99 kg/m      Assessment &  Plan:  Erica Burns was seen today for swollen great , right toe.  Diagnoses and all orders for this visit:  Pain of right great toe -     DG Toe Great Right; Future -     BMP8+EGFR  Acute gout due to renal impairment involving toe of right foot -     Uric acid -     BMP8+EGFR  CKD (chronic kidney disease), stage V (HCC)  Other orders -     traMADol (ULTRAM) 50 MG tablet; Take 1-2 tablets (50-100 mg total) by mouth every 12 (twelve) hours as needed.  Pt has nephrologists appt tomorrow, will hold off on medication until she is seen Uric acid lab pending [place] discussed Will give Ultram and note to be excused from work Keep foot elevated  Rest RTO if symptoms worsen or do not improve   Erica Dun, FNP

## 2018-04-25 ENCOUNTER — Ambulatory Visit (INDEPENDENT_AMBULATORY_CARE_PROVIDER_SITE_OTHER): Payer: 59 | Admitting: Psychiatry

## 2018-04-25 DIAGNOSIS — F4310 Post-traumatic stress disorder, unspecified: Secondary | ICD-10-CM | POA: Diagnosis not present

## 2018-04-25 DIAGNOSIS — F419 Anxiety disorder, unspecified: Secondary | ICD-10-CM

## 2018-04-25 LAB — BMP8+EGFR
BUN/Creatinine Ratio: 10 (ref 9–23)
BUN: 38 mg/dL — AB (ref 6–20)
CALCIUM: 8.7 mg/dL (ref 8.7–10.2)
CHLORIDE: 108 mmol/L — AB (ref 96–106)
CO2: 19 mmol/L — AB (ref 20–29)
Creatinine, Ser: 3.91 mg/dL (ref 0.57–1.00)
GFR calc Af Amer: 16 mL/min/{1.73_m2} — ABNORMAL LOW (ref >59)
GFR calc non Af Amer: 14 mL/min/{1.73_m2} — ABNORMAL LOW (ref >59)
GLUCOSE: 72 mg/dL (ref 65–99)
POTASSIUM: 4.6 mmol/L (ref 3.5–5.2)
Sodium: 141 mmol/L (ref 134–144)

## 2018-04-25 LAB — URIC ACID: URIC ACID: 8 mg/dL — AB (ref 2.5–7.1)

## 2018-04-25 MED ORDER — BUSPIRONE HCL 5 MG PO TABS: 5.0000 mg | ORAL_TABLET | Freq: Two times a day (BID) | ORAL | 0 refills | Status: DC

## 2018-04-25 MED ORDER — TRAZODONE HCL 100 MG PO TABS: 150.0000 mg | ORAL_TABLET | Freq: Every evening | ORAL | 0 refills | Status: DC | PRN

## 2018-04-25 MED ORDER — ARIPIPRAZOLE 5 MG PO TABS: 5.0000 mg | ORAL_TABLET | Freq: Every day | ORAL | 0 refills | Status: DC

## 2018-04-25 NOTE — Patient Instructions (Signed)
1. Discontinue Geodon 2. Start Abilify 5 mg at night  3. Continue Buspar 5 mg twice a day  4. Continue Trazodone 150 mg at night as needed for insomnia 5. Return to clinic in one month for 15 mins 6.  Obtain record from nephrology

## 2018-05-04 ENCOUNTER — Emergency Department (HOSPITAL_COMMUNITY)
Admission: EM | Admit: 2018-05-04 | Discharge: 2018-05-05 | Disposition: A | Discharge status: Home / Self Care | Payer: Managed Care, Other (non HMO) | Attending: Emergency Medicine | Admitting: Emergency Medicine

## 2018-05-04 ENCOUNTER — Other Ambulatory Visit: Payer: Self-pay

## 2018-05-04 ENCOUNTER — Encounter (HOSPITAL_COMMUNITY): Payer: Self-pay | Admitting: Emergency Medicine

## 2018-05-04 DIAGNOSIS — Z79899 Other long term (current) drug therapy: Secondary | ICD-10-CM | POA: Insufficient documentation

## 2018-05-04 DIAGNOSIS — Z91040 Latex allergy status: Secondary | ICD-10-CM | POA: Diagnosis not present

## 2018-05-04 DIAGNOSIS — Z87891 Personal history of nicotine dependence: Secondary | ICD-10-CM | POA: Diagnosis not present

## 2018-05-04 DIAGNOSIS — M25552 Pain in left hip: Secondary | ICD-10-CM | POA: Diagnosis not present

## 2018-05-04 DIAGNOSIS — N189 Chronic kidney disease, unspecified: Secondary | ICD-10-CM | POA: Insufficient documentation

## 2018-05-04 DIAGNOSIS — I129 Hypertensive chronic kidney disease with stage 1 through stage 4 chronic kidney disease, or unspecified chronic kidney disease: Secondary | ICD-10-CM | POA: Insufficient documentation

## 2018-05-04 DIAGNOSIS — M79652 Pain in left thigh: Secondary | ICD-10-CM

## 2018-05-04 NOTE — ED Triage Notes (Signed)
Pt c/o left hip that is radiating down left leg since this am. tramodol taken with no relief. Nad.

## 2018-05-05 ENCOUNTER — Ambulatory Visit (HOSPITAL_COMMUNITY)
Admit: 2018-05-05 | Discharge: 2018-05-05 | Disposition: A | Discharge status: Home / Self Care | Payer: Managed Care, Other (non HMO) | Source: Ambulatory Visit | Attending: Emergency Medicine | Admitting: Emergency Medicine

## 2018-05-05 DIAGNOSIS — R600 Localized edema: Secondary | ICD-10-CM | POA: Insufficient documentation

## 2018-05-05 DIAGNOSIS — M79652 Pain in left thigh: Secondary | ICD-10-CM | POA: Insufficient documentation

## 2018-05-05 LAB — I-STAT CHEM 8, ED
BUN: 42 mg/dL — ABNORMAL HIGH (ref 6–20)
CREATININE: 3.5 mg/dL — AB (ref 0.44–1.00)
Calcium, Ion: 1.15 mmol/L (ref 1.15–1.40)
Chloride: 107 mmol/L (ref 98–111)
Glucose, Bld: 76 mg/dL (ref 70–99)
HEMATOCRIT: 38 % (ref 36.0–46.0)
HEMOGLOBIN: 12.9 g/dL (ref 12.0–15.0)
Potassium: 4 mmol/L (ref 3.5–5.1)
Sodium: 142 mmol/L (ref 135–145)
TCO2: 23 mmol/L (ref 22–32)

## 2018-05-05 MED ORDER — OXYCODONE-ACETAMINOPHEN 5-325 MG PO TABS
1.0000 | ORAL_TABLET | Freq: Once | ORAL | Status: AC
  Administered 2018-05-05: 1 via ORAL
  Filled 2018-05-05: qty 1

## 2018-05-05 NOTE — Discharge Instructions (Addendum)
You are seen in the ER for the left eye pain. However exam does not reveal any evidence of infection or traumatic injury.  You have history of medical conditions that do put you at high risk of having a blood clot, therefore we think it is prudent that we get an ultrasound of your leg to rule out a blood clot.  Please follow the instructions provided on getting the ultrasound done tomorrow morning. As discussed, return to the ER if your symptoms get worse.  If the symptoms are not improving then you must see your primary care doctor for potentially repeat ultrasound, even if the first ultrasound is normal.

## 2018-05-05 NOTE — ED Provider Notes (Signed)
[facility] EMERGENCY DEPARTMENT Provider Note   CSN: [id number] Arrival date & time: 05/04/18  2224     History   Chief Complaint Chief Complaint  Patient presents with  . Hip Pain    HPI Erica Burns is a 37 y.o. female.  HPI 37 year old female with history of anxiety, CKD, lupus anticoagulant syndrome, PCOS comes in with chief complaint of hip pain.  Patient reports she woke up this morning with anterior left thigh pain.  The pain starts right below the inguinal crease anteriorly, and radiates towards her knee.  Pain is described as sharp pain without any associated numbness, tingling. Patient denies any history of similar pain in the past and could not recall any traumatic event that provoked the pain.  Patient denies any history of DVT, PE. Past Medical History:  Diagnosis Date  . Anxiety   . Bipolar disorder (HCC)   . CKD (chronic kidney disease) stage 5, GFR less than 15 ml/min (HCC)   . Essential hypertension   . Fibromyalgia   . GERD (gastroesophageal reflux disease)   . Gout   . History of seizures    Single episode reportedly secondary to Imitrex  . Hypothyroidism   . Iron deficiency anemia   . Low back pain   . Lupus anticoagulant syndrome (HCC)   . Migraine headache   . MRSA carrier   . Obesity   . OCD (obsessive compulsive disorder)   . PCOS (polycystic ovarian syndrome)   . PTSD (post-traumatic stress disorder)   . Vitamin D deficiency     Patient Active Problem List   Diagnosis Date Noted  . Vitamin D deficiency 12/15/2017  . CAD (coronary artery disease) 12/15/2017  . Iron deficiency anemia 12/15/2017  . Insomnia 12/15/2017  . Hypothyroidism 12/15/2017  . Low back pain 12/15/2017  . GERD (gastroesophageal reflux disease) 12/15/2017  . Hx of bariatric surgery 12/15/2017  . Bipolar 2 disorder (HCC) 12/15/2017  . CKD (chronic kidney disease), stage V (HCC) 12/15/2017  . PCOS (polycystic ovarian syndrome) 12/15/2017  . PTSD (post-traumatic  stress disorder) 12/15/2017  . Fibromyalgia 12/15/2017  . Seizure disorder (HCC) 12/15/2017  . OCD (obsessive compulsive disorder) 12/15/2017  . Gout 12/15/2017  . Hx of degenerative disc disease 12/15/2017  . Vitamin B 12 deficiency 12/15/2017  . 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 Morbid (severe) obesity due to excess calories (HCC) 05/03/2016  . Proteinuria 05/03/2016  . 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 Lupus anticoagulant positive 01/30/2012  . 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 Anxiety 07/16/2009  . Depression 07/16/2009  . Asthma 07/16/2009  . Hypertension 03/05/2002    Past Surgical History:  Procedure Laterality Date  . Arthoscopic knee surgery    . AV FISTULA PLACEMENT    . BARIATRIC SURGERY    . Cyst removal    . RENAL BIOPSY       OB History   None      Home Medications    Prior to Admission medications   Medication Sig Start Date End Date Taking? Authorizing Provider  albuterol (PROVENTIL HFA;VENTOLIN HFA) 108 (90 Base) MCG/ACT inhaler TAKE 2 PUFFS BY MOUTH EVERY 6 HOURS AS NEEDED FOR WHEEZE OR SHORTNESS OF BREATH 02/14/18   Hawks, Neysa Bonitohristy A, FNP  ARIPiprazole (ABILIFY) 5 MG tablet Take 1 tablet (5 mg total) by mouth daily. 04/25/18   Neysa HotterHisada, Reina, MD  beclomethasone (QVAR REDIHALER) 40  MCG/ACT inhaler Inhale 2 puffs into the lungs 2 (two) times daily. 03/19/18   Junie Spencer, FNP  busPIRone (BUSPAR) 5 MG tablet Take 1 tablet (5 mg total) by mouth 2 (two) times daily. 04/25/18 05/25/18  Neysa Hotter, MD  cloNIDine (CATAPRES) 0.1 MG tablet Take 1 tablet (0.1 mg total) by mouth 2 (two) times daily. 03/19/18   Junie Spencer, FNP  diltiazem (CARDIZEM CD) 180 MG 24 hr capsule Take 1 capsule (180 mg total) by mouth daily. 03/19/18   Jannifer Rodney A, FNP  ferrous sulfate 325 (65 FE) MG tablet Take 325 mg by mouth daily.      [provider]  levothyroxine (SYNTHROID, LEVOTHROID) 50 MCG tablet Take 1 tablet (50 mcg total) by mouth daily before breakfast. 03/19/18   Jannifer Rodney A, FNP  lidocaine (LIDODERM) 5 % Place 1 patch onto the skin daily.  11/12/17   [provider]  promethazine (PHENERGAN) 25 MG tablet TAKE 1 TABLET BY MOUTH TWICE A DAY AS NEEDED FOR MOTION SICKNESS 03/19/18   Jannifer Rodney A, FNP  traMADol (ULTRAM) 50 MG tablet Take 1-2 tablets (50-100 mg total) by mouth every 12 (twelve) hours as needed. 04/24/18   Junie Spencer, FNP  traZODone (DESYREL) 100 MG tablet Take 1.5 tablets (150 mg total) by mouth at bedtime as needed for sleep. 04/25/18   Neysa Hotter, MD    Family History Family History  Problem Relation Age of Onset  . Hypertension Mother   . Hyperlipidemia Mother   . Hypertension Father   . Hyperlipidemia Father   . Kidney Stones Father     Social History Social History   Tobacco Use  . Smoking status: Former Smoker    Packs/day: 0.25    Types: Cigarettes  . Smokeless tobacco: Never Used  Substance Use Topics  . Alcohol use: No    Frequency: Never  . Drug use: No     Allergies   Eggs or egg-derived products; Imitrex [sumatriptan]; Latex; Nsaids; Soybean oil; and Whey   Review of Systems Review of Systems  Constitutional: Positive for activity change.  Skin: Negative for rash.  Neurological: Negative for numbness.  Hematological: Does not bruise/bleed easily.     Physical Exam Updated Vital Signs BP (!) 145/91 (BP Location: Right Arm)   Pulse 91   Temp 97.8 F (36.6 C) (Oral)   Resp 18   Ht 5\' 4"  (1.626 m)   Wt 99.8 kg (220 lb)   LMP 04/10/2018   SpO2 100%   BMI 37.76 kg/m   Physical Exam  Constitutional: She is oriented to person, place, and time. She appears well-developed.  HENT:  Head: Normocephalic and atraumatic.  Eyes: EOM are normal.  Neck: Normal range of motion. Neck supple.  Cardiovascular: Normal rate.    Pulmonary/Chest: Effort normal.  Abdominal: Bowel sounds are normal.  Musculoskeletal: She exhibits no edema.  Left lower extremity slightly edematous and there is tenderness over the proximal calf.  There is no erythema, edema or reproducible tenderness to palpation over the left anterior thigh. Patient's tenderness is worse with active leg raise, but the hip range of motion is intact.  Neurological: She is alert and oriented to person, place, and time.  Skin: Skin is warm and dry.  Nursing note and vitals reviewed.    ED Treatments / Results  Labs (all labs ordered are listed, but only abnormal results are displayed) Labs Reviewed  I-STAT CHEM 8, ED - Abnormal; Notable for the following  components:      Result Value   BUN 42 (*)    Creatinine, Ser 3.50 (*)    All other components within normal limits    EKG None  Radiology No results found.  Procedures Procedures (including critical care time)  Medications Ordered in ED Medications  oxyCODONE-acetaminophen (PERCOCET/ROXICET) 5-325 MG per tablet 1 tablet (1 tablet Oral Given 05/05/18 0021)     Initial Impression / Assessment and Plan / ED Course  I have reviewed the triage vital signs and the nursing notes.  Pertinent labs & imaging results that were available during my care of the patient were reviewed by me and considered in my medical decision making (see chart for details).     37 year old female comes in with chief complaint of left thigh pain.  Patient has history of lupus anticoagulant syndrome, polycystic ovarian syndrome both of which puts her at high risk of having DVT.  She on exam does have mild edema of the left lower extremity.  The exam and history is not consistent with musculoskeletal pain, infection, arterial insufficiency, myositis -therefore DVT has to be considered to be a condition that needs to be ruled out.  We will order an outpatient ultrasound for tomorrow morning. Patient has been  informed that the sensitivity of DVT study is not 100%, therefore she will have to see her PCP in 1 week to see if she needs repeat ultrasound.  Patient is in agreement with the plan.  Final Clinical Impressions(s) / ED Diagnoses   Final diagnoses:  Acute pain of left thigh    ED Discharge Orders        Ordered    US Venous Img Lower Unilateral Left     05/05/18 0024       Derwood Kaplan, MD 05/05/18 [phone]

## 2018-05-15 ENCOUNTER — Telehealth: Payer: Self-pay | Admitting: Family

## 2018-05-15 ENCOUNTER — Ambulatory Visit (INDEPENDENT_AMBULATORY_CARE_PROVIDER_SITE_OTHER): Payer: Managed Care, Other (non HMO) | Admitting: Family

## 2018-05-15 ENCOUNTER — Encounter: Payer: Self-pay | Admitting: Family

## 2018-05-15 VITALS — BP 135/89 | HR 96 | Temp 98.0°F | Ht 64.0 in | Wt 232.0 lb

## 2018-05-15 DIAGNOSIS — M10371 Gout due to renal impairment, right ankle and foot: Secondary | ICD-10-CM

## 2018-05-15 MED ORDER — PREDNISONE 10 MG (21) PO TBPK: ORAL_TABLET | ORAL | 0 refills | Status: AC

## 2018-05-15 MED ORDER — METHYLPREDNISOLONE ACETATE 80 MG/ML IJ SUSP
80.0000 mg | Freq: Once | INTRAMUSCULAR | Status: AC
  Administered 2018-05-15: 80 mg via INTRAMUSCULAR

## 2018-05-15 MED ORDER — TRAMADOL HCL 50 MG PO TABS: 50.0000 mg | ORAL_TABLET | Freq: Two times a day (BID) | ORAL | 0 refills | Status: AC | PRN

## 2018-05-15 NOTE — Telephone Encounter (Signed)
What symptoms do you have? Swelling, pain and hot to touch. Thinks its gout  How long have you been sick? Since Saturday  Have you been seen for this problem? Not recently  If your provider decides to give you a prescription, which pharmacy would you like for it to be sent to[facility] [place]   Patient informed that this information will be sent to the clinical staff for review and that they should receive a follow up call.

## 2018-05-15 NOTE — Telephone Encounter (Signed)
Apt made with hawks today.

## 2018-05-15 NOTE — Patient Instructions (Signed)

## 2018-05-15 NOTE — Progress Notes (Signed)
   Subjective:    Patient ID: Erica Burns, female    DOB: 22-Dec-1980, 37 y.o.   MRN: [id number]  Chief Complaint  Patient presents with  . right toe pain    gout?    HPI PT presents to the office with right great toe pain. She was seen in the office last week with an Uric Acid level of 8. She was given prednisone and after completing she started on allopurinol 100 mg daily. States she has been on the allopurinol for about a week and about three days ago she noticed she was having constant aching 8 out 10.    Review of Systems  Musculoskeletal: Positive for arthralgias.  All other systems reviewed and are negative.      Objective:   Physical Exam  Constitutional: She is oriented to person, place, and time. She appears well-developed and well-nourished. No distress.  HENT:  Head: Normocephalic.  Cardiovascular: Normal rate, regular rhythm, normal heart sounds and intact distal pulses.  No murmur heard. Pulmonary/Chest: Effort normal and breath sounds normal. No respiratory distress. She has no wheezes.  Abdominal: Soft. Bowel sounds are normal. She exhibits no distension. There is no tenderness.  Musculoskeletal: She exhibits tenderness (extreme tenderness of right great toe, wamrth, and slight redness present ). She exhibits no edema.  Neurological: She is alert and oriented to person, place, and time. She has normal reflexes. No cranial nerve deficit.  Skin: Skin is warm and dry.  Psychiatric: She has a normal mood and affect. Her behavior is normal. Judgment and thought content normal.  Vitals reviewed.     BP 135/89   Pulse 96   Temp 98 F (36.7 C) (Oral)   Ht 5\' 4"  (1.626 m)   Wt 232 lb (105.2 kg)   BMI 39.82 kg/m      Assessment & Plan:  Erica Burns comes in today with chief complaint of right toe pain (gout?)   Diagnosis and orders addressed:  1. 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  Dispense: 40 tablet; Refill: 0 - methylPREDNISolone acetate (DEPO-MEDROL) injection 80 mg   Jannifer Rodneyhristy Camylle Whicker, FNP

## 2018-05-17 ENCOUNTER — Other Ambulatory Visit (HOSPITAL_COMMUNITY): Payer: Self-pay | Admitting: Psychiatry

## 2018-05-17 DIAGNOSIS — F419 Anxiety disorder, unspecified: Secondary | ICD-10-CM

## 2018-05-17 MED ORDER — BUSPIRONE HCL 5 MG PO TABS
5.0000 mg | ORAL_TABLET | Freq: Two times a day (BID) | ORAL | 0 refills | Status: AC
Start: 2018-05-17 — End: 2018-08-15

## 2018-05-17 MED ORDER — ARIPIPRAZOLE 5 MG PO TABS: 5.0000 mg | ORAL_TABLET | Freq: Every day | ORAL | 0 refills | Status: DC
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
## 2018-05-29 ENCOUNTER — Ambulatory Visit (INDEPENDENT_AMBULATORY_CARE_PROVIDER_SITE_OTHER): Payer: 59 | Admitting: Licensed Clinical Social Worker

## 2018-05-29 DIAGNOSIS — F4310 Post-traumatic stress disorder, unspecified: Secondary | ICD-10-CM | POA: Diagnosis not present

## 2018-05-29 DIAGNOSIS — F3181 Bipolar II disorder: Secondary | ICD-10-CM | POA: Diagnosis not present

## 2018-05-30 ENCOUNTER — Ambulatory Visit (INDEPENDENT_AMBULATORY_CARE_PROVIDER_SITE_OTHER): Payer: 59 | Admitting: Psychiatry

## 2018-05-30 ENCOUNTER — Encounter (HOSPITAL_COMMUNITY): Payer: Self-pay | Admitting: Licensed Clinical Social Worker

## 2018-05-30 ENCOUNTER — Encounter (HOSPITAL_COMMUNITY): Payer: Self-pay | Admitting: Psychiatry

## 2018-05-30 VITALS — BP 119/86 | HR 94 | Ht 64.0 in | Wt 231.0 lb

## 2018-05-30 DIAGNOSIS — F4310 Post-traumatic stress disorder, unspecified: Secondary | ICD-10-CM

## 2018-05-30 DIAGNOSIS — F3181 Bipolar II disorder: Secondary | ICD-10-CM | POA: Diagnosis not present

## 2018-05-30 MED ORDER — TRAZODONE HCL 100 MG PO TABS: 150.0000 mg | ORAL_TABLET | Freq: Every evening | ORAL | 0 refills | Status: DC | PRN

## 2018-05-30 MED ORDER — ZIPRASIDONE HCL 20 MG PO CAPS: 20.0000 mg | ORAL_CAPSULE | Freq: Two times a day (BID) | ORAL | 0 refills | Status: DC

## 2018-05-30 NOTE — Patient Instructions (Signed)
1. Discontinue Abilify  2. Reinitiate Geodon 20 mg twice a day 2. Increase Buspar 5 mg three times a day  3. Continue Trazodone 150 mg at night as needed for insomnia 4. Return to clinic in one month for 15 mins
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
## 2018-06-26 ENCOUNTER — Other Ambulatory Visit (HOSPITAL_COMMUNITY): Payer: Self-pay | Admitting: Psychiatry

## 2018-06-26 DIAGNOSIS — F4310 Post-traumatic stress disorder, unspecified: Secondary | ICD-10-CM

## 2018-06-26 MED ORDER — TRAZODONE HCL 100 MG PO TABS: 150.0000 mg | ORAL_TABLET | Freq: Every evening | ORAL | 1 refills | Status: DC | PRN

## 2018-06-26 MED ORDER — ZIPRASIDONE HCL 20 MG PO CAPS: 20.0000 mg | ORAL_CAPSULE | Freq: Two times a day (BID) | ORAL | 1 refills | Status: AC

## 2018-07-02 ENCOUNTER — Ambulatory Visit (HOSPITAL_COMMUNITY): Payer: Self-pay | Admitting: Psychiatry

## 2018-07-18 ENCOUNTER — Ambulatory Visit (HOSPITAL_COMMUNITY): Payer: Self-pay | Admitting: Licensed Clinical Social Worker

## 2018-07-19 ENCOUNTER — Ambulatory Visit: Payer: Managed Care, Other (non HMO) | Admitting: Family

## 2018-07-19 ENCOUNTER — Encounter: Payer: Managed Care, Other (non HMO) | Admitting: Family

## 2018-07-25 ENCOUNTER — Other Ambulatory Visit (HOSPITAL_COMMUNITY): Payer: Self-pay | Admitting: Psychiatry

## 2018-07-25 DIAGNOSIS — F4310 Post-traumatic stress disorder, unspecified: Secondary | ICD-10-CM

## 2018-07-25 MED ORDER — TRAZODONE HCL 100 MG PO TABS: 150.0000 mg | ORAL_TABLET | Freq: Every evening | ORAL | 0 refills | Status: AC | PRN

## 2018-08-01 ENCOUNTER — Ambulatory Visit (HOSPITAL_COMMUNITY): Payer: Self-pay | Admitting: Licensed Clinical Social Worker
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
## 2018-08-08 ENCOUNTER — Ambulatory Visit (HOSPITAL_COMMUNITY): Payer: 59 | Admitting: Psychiatry

## 2018-08-15 ENCOUNTER — Ambulatory Visit (HOSPITAL_COMMUNITY): Payer: Self-pay | Admitting: Licensed Clinical Social Worker

## 2018-08-29 ENCOUNTER — Ambulatory Visit (HOSPITAL_COMMUNITY): Payer: Self-pay | Admitting: Licensed Clinical Social Worker

## 2018-08-31 ENCOUNTER — Other Ambulatory Visit (HOSPITAL_COMMUNITY): Payer: Self-pay | Admitting: Psychiatry

## 2018-08-31 MED ORDER — BUSPIRONE HCL 5 MG PO TABS: 5.0000 mg | ORAL_TABLET | Freq: Three times a day (TID) | ORAL | 0 refills | Status: AC

## 2018-09-12 ENCOUNTER — Ambulatory Visit (HOSPITAL_COMMUNITY): Payer: Self-pay | Admitting: Licensed Clinical Social Worker

## 2018-09-26 ENCOUNTER — Ambulatory Visit (HOSPITAL_COMMUNITY): Payer: Self-pay | Admitting: Licensed Clinical Social Worker

## 2019-08-21 IMAGING — CT CT CHEST W/O CM
2 of 3 series · 15 of 36 positions shown, 18 images · non-contrast
Comparison: 02/23/2018 plain film.

CLINICAL DATA: Weakness for 2 days. Persistent cough. Recurrent
bronchitis. Possible right-sided pulmonary nodule on chest
radiograph.

EXAM:
CT CHEST WITHOUT CONTRAST
TECHNIQUE: Multidetector CT imaging of the chest was performed following the
standard protocol without IV contrast.

[Series 2: thorax · axial · 0.72mm/px · z∈[-212,+42]mm · 12 of 151 slices shown, 15 images]
[im 12/151  mediastinal]
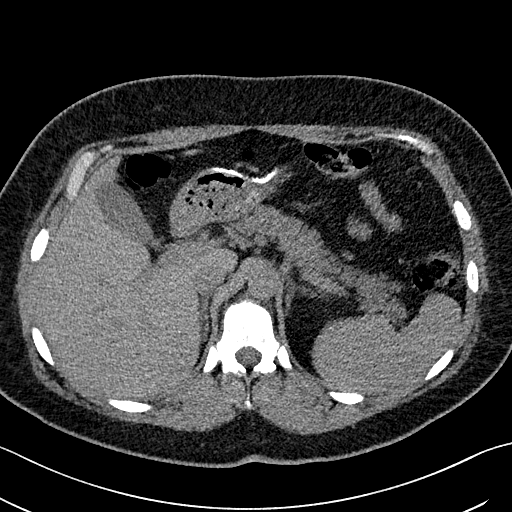
[im 12/151  lung]
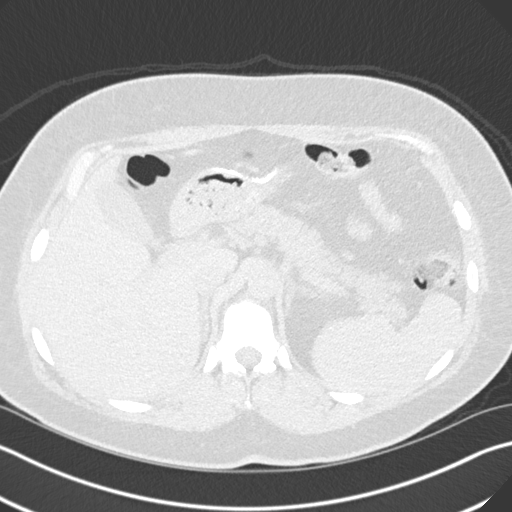
[im 23/151  lung]
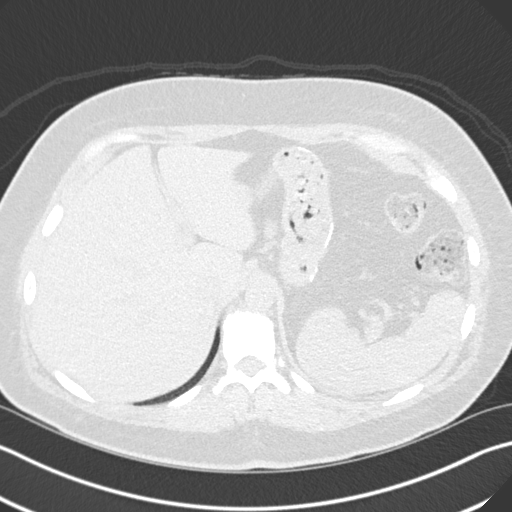
[im 34/151  lung]
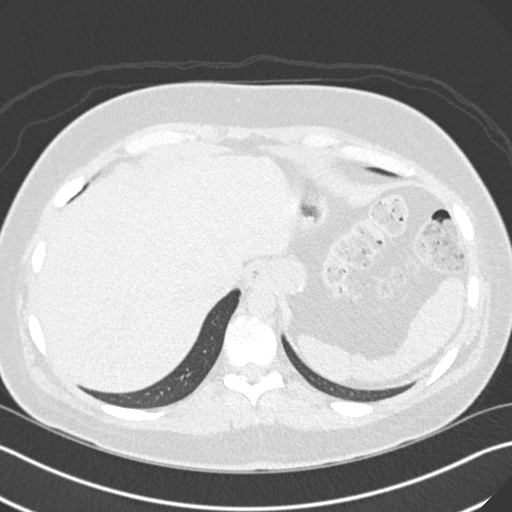
[im 45/151  lung]
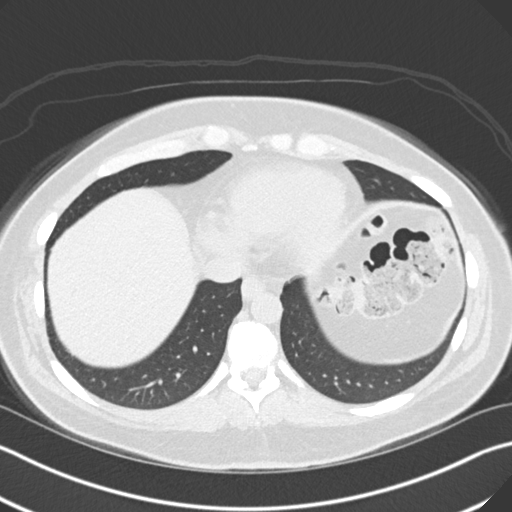
[im 56/151  mediastinal]
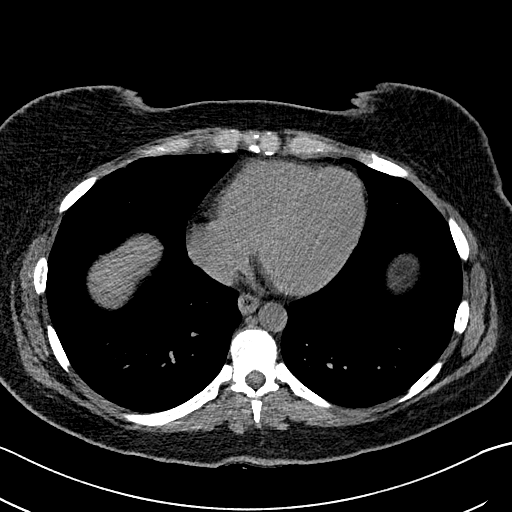
[im 56/151  lung]
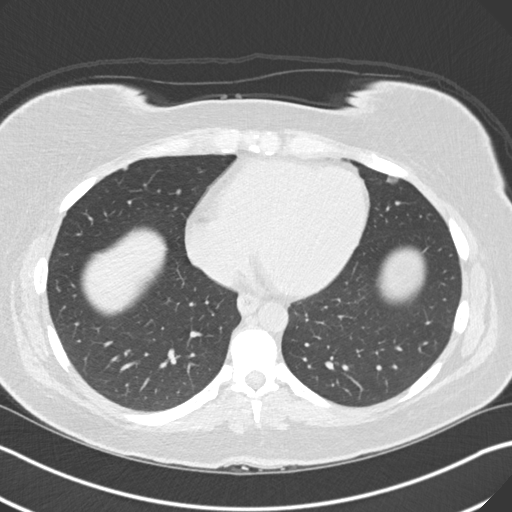
[im 67/151  lung]
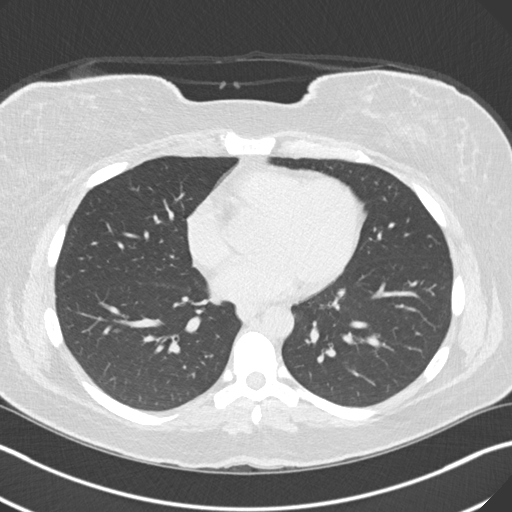
[im 84/151  lung]
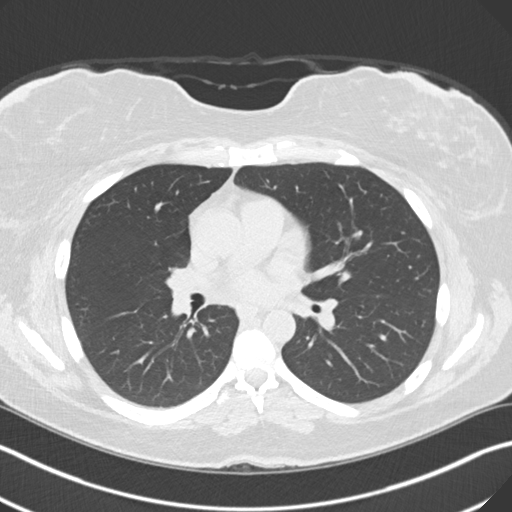
[im 95/151  lung]
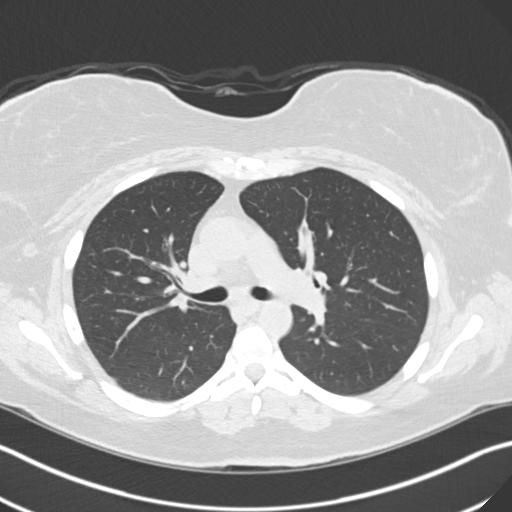
[im 106/151  mediastinal]
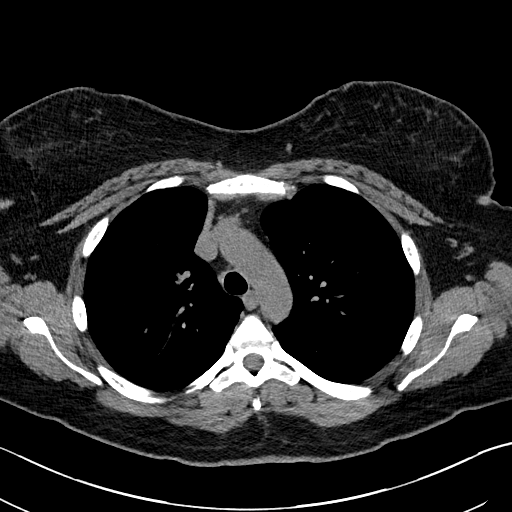
[im 106/151  lung]
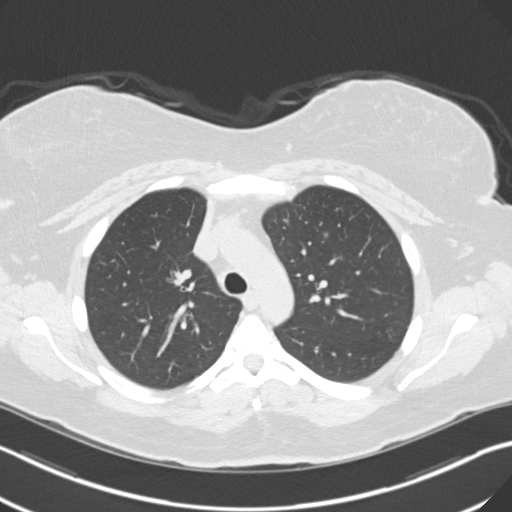
[im 117/151  lung]
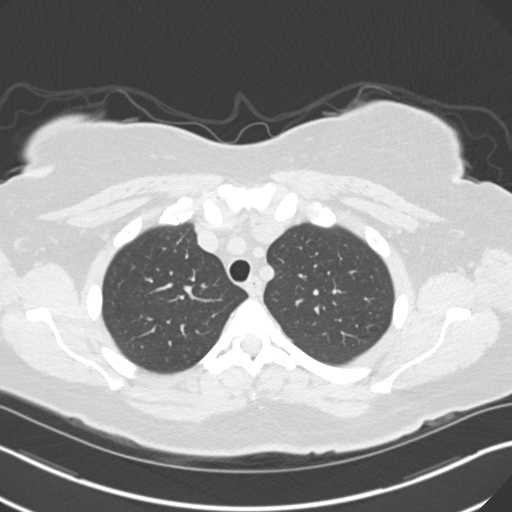
[im 128/151  lung]
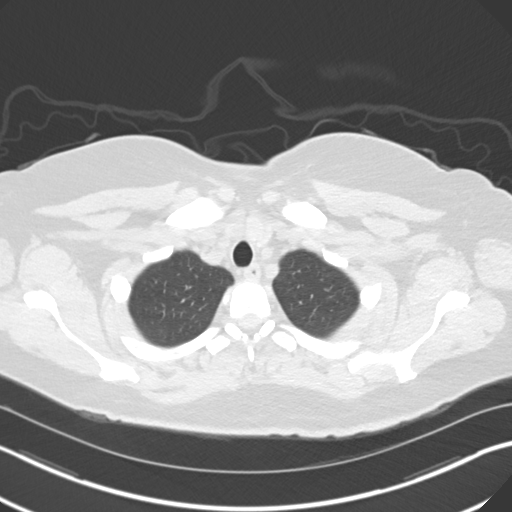
[im 139/151  lung]
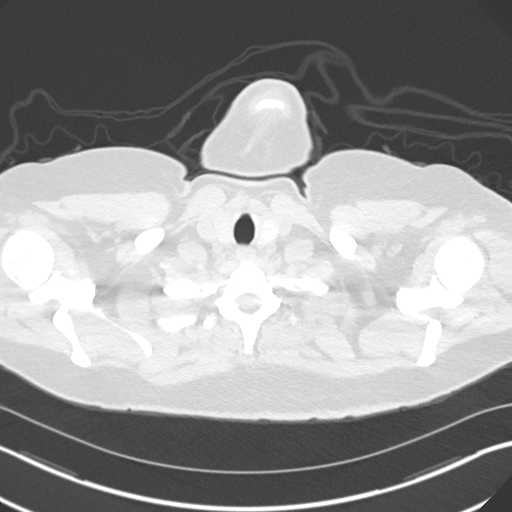

[Series 5: coronal · coronal · 0.61mm/px · 3 of 151 slices shown]
[im 31/151  lung]
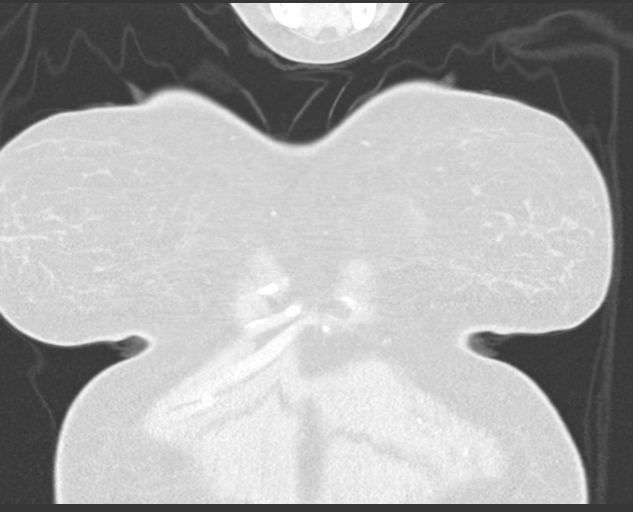
[im 61/151  lung]
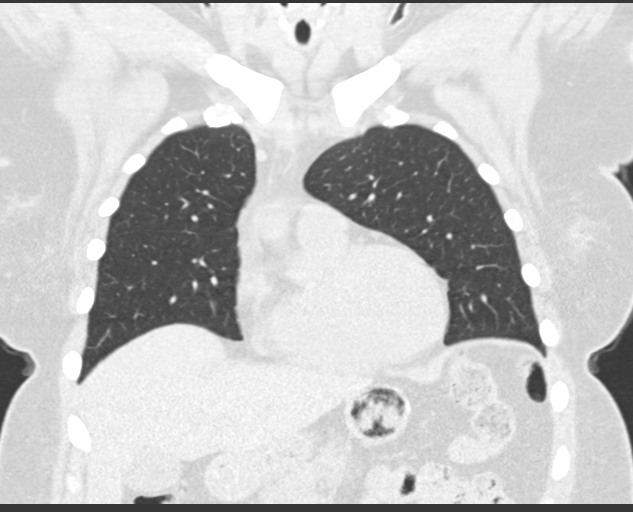
[im 91/151  lung]
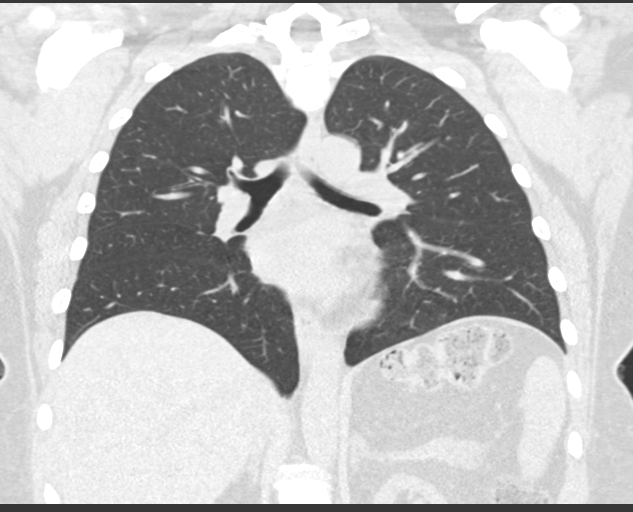

[15 of 36 positions shown; findings below may reference images not displayed]

FINDINGS: Cardiovascular: Normal caliber of the aorta and branch vessels.
Normal heart size, without pericardial effusion.

Mediastinum/Nodes: No mediastinal or definite hilar adenopathy,
given limitations of unenhanced CT. Surgical changes at the
gastroesophageal junction.

Lungs/Pleura: No pleural fluid. No explanation for cough. No
correlate for the plain film abnormality, which may have been due to
a summation of ribs and vascular structures.

There are scattered tiny bilateral pulmonary nodules, primarily
subpleural in distribution. These are identified on series 4. The
measure on the order of 4 mm or less (example image 59/4). There is
also a more vague 5 mm left upper lobe pulmonary nodule on image
45/4.

Upper Abdomen: Gastric body surgery as well. Normal imaged portions
of the liver, spleen, pancreas, gallbladder, adrenal glands. Left
renal atrophy.

Musculoskeletal: Mild thoracic spondylosis.
IMPRESSION: 1.  No acute process or explanation for cough.
2. No correlate for the plain film abnormality within the right
lung. No dominant pulmonary nodule. Scattered small pulmonary
nodules, maximally 5 mm. No follow-up needed if patient is low-risk.
Non-contrast chest CT can be considered in 12 months if patient is
high-risk. This recommendation follows the consensus statement:
Guidelines for Management of Incidental Pulmonary Nodules Detected

## 2019-12-09 IMAGING — US US EXTREM LOW VENOUS*L*
1 series · 13 of 24 positions shown · non-contrast
Comparison: None.

CLINICAL DATA: 36-year-old female with a history of thigh pain



[Series 1: us extrem low venous*left* · 0.08mm/px · 13 of 35 slices shown]
[im 1/35]
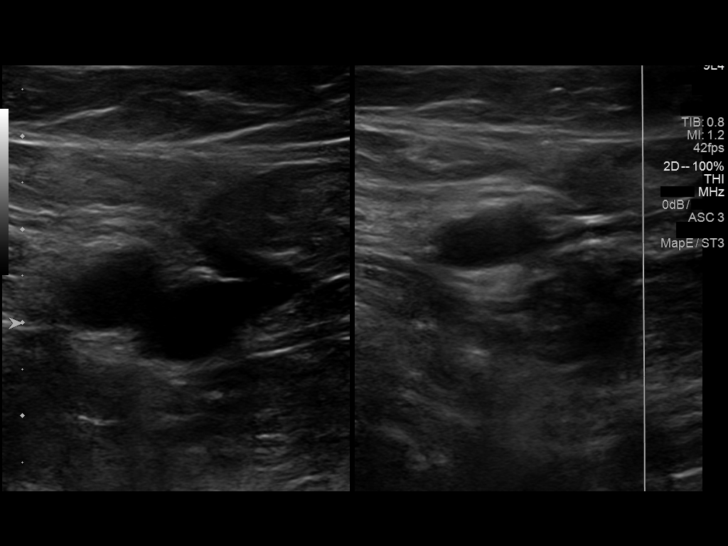
[im 3/35]
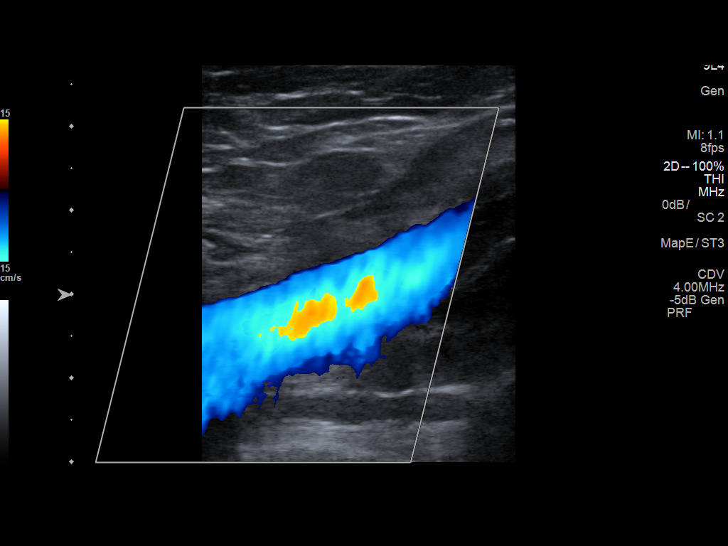
[im 6/35]
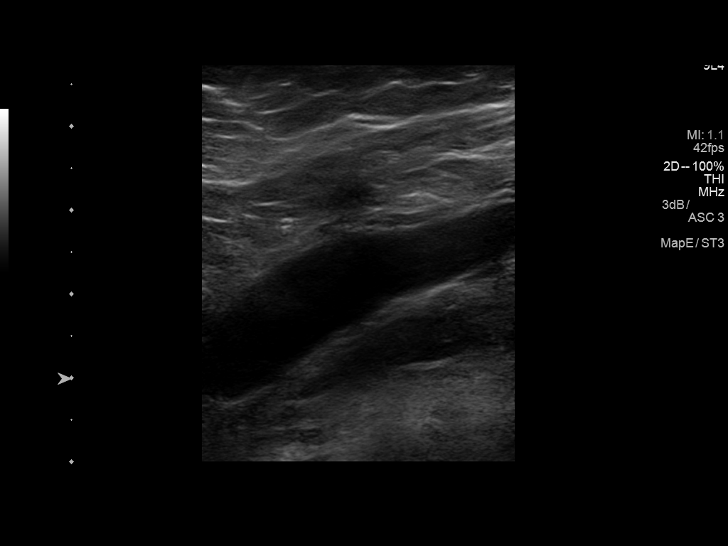
[im 9/35]
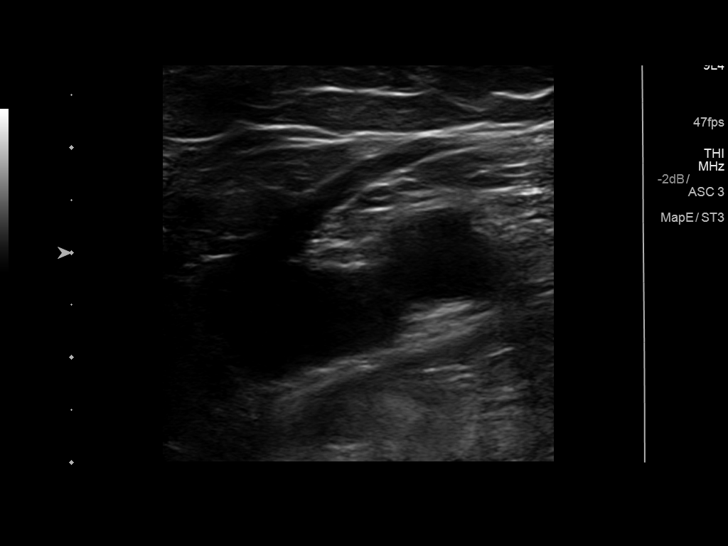
[im 12/35]
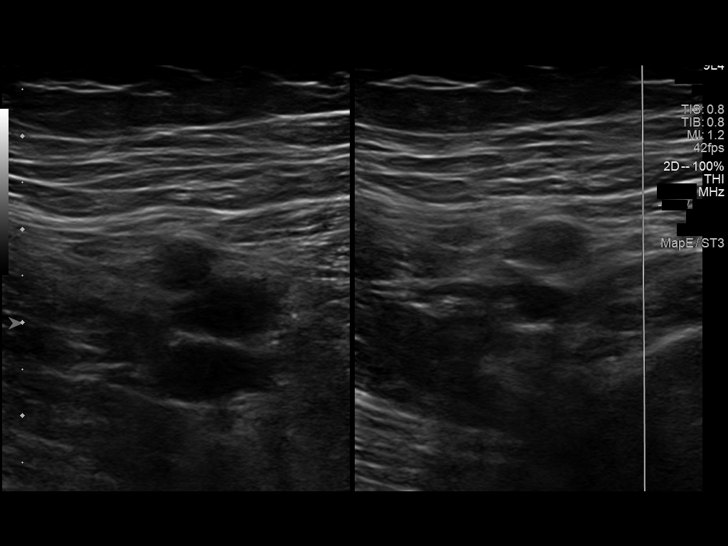
[im 15/35]
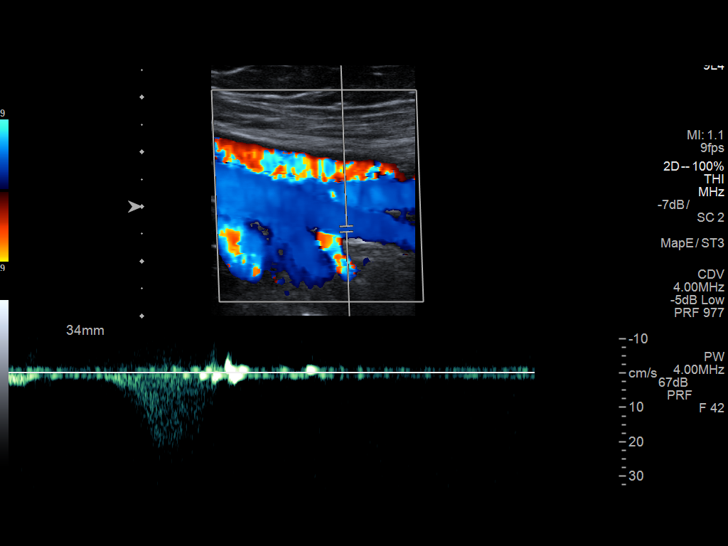
[im 18/35]
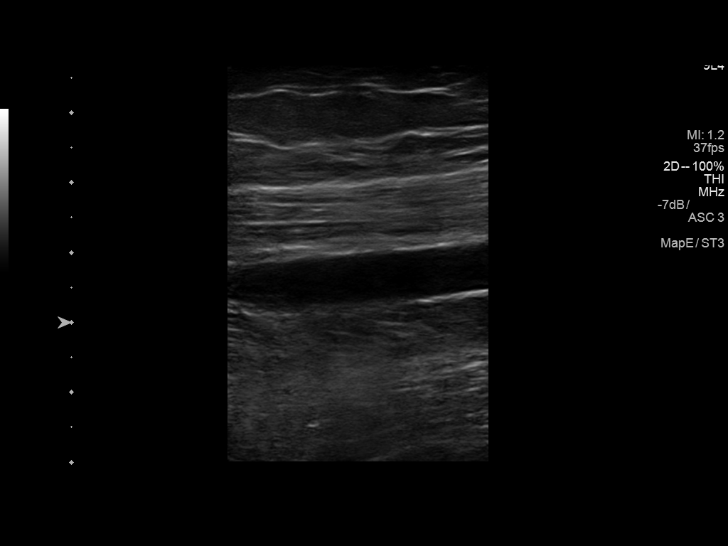
[im 20/35]
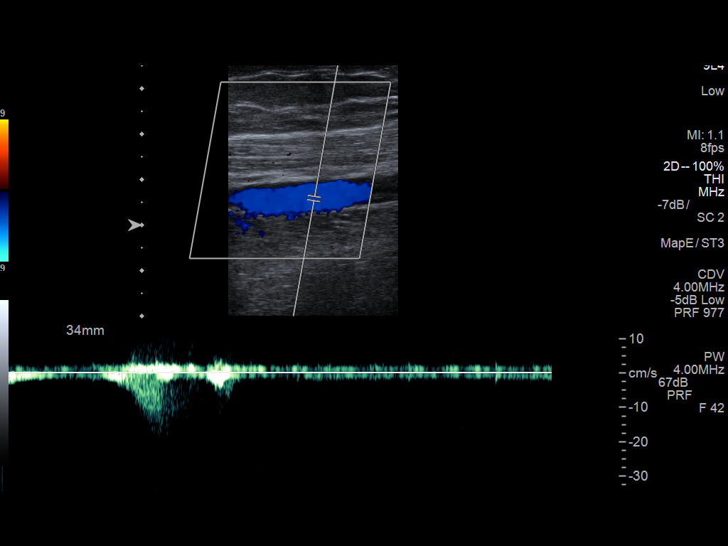
[im 23/35]
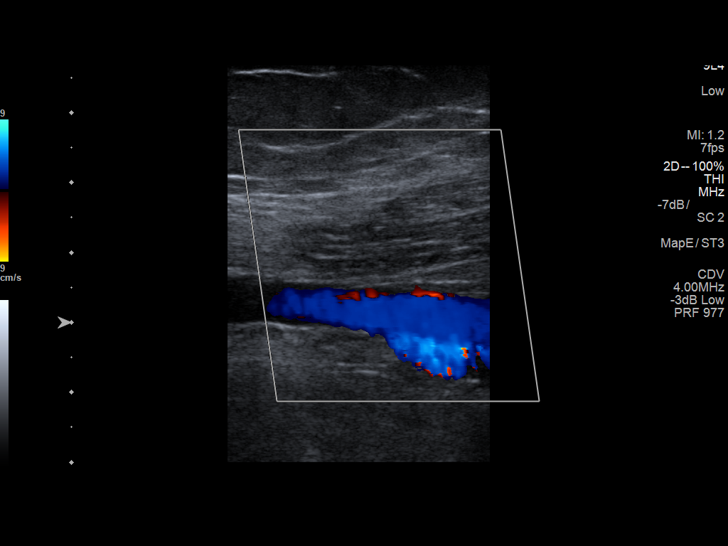
[im 26/35]
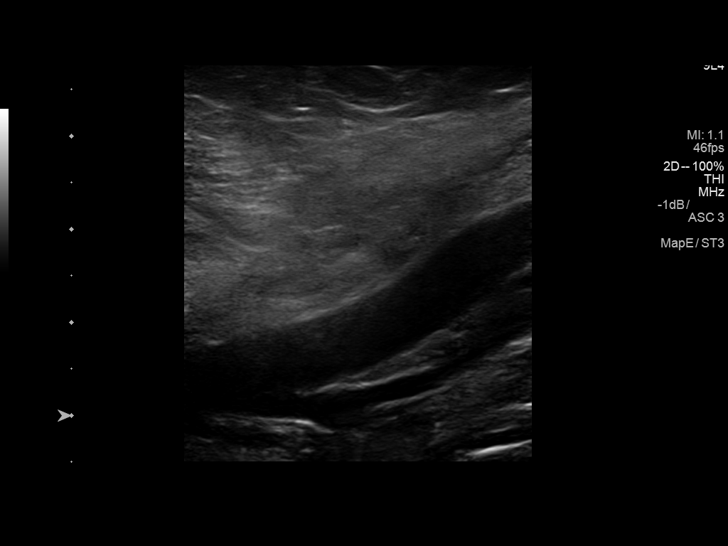
[im 29/35]
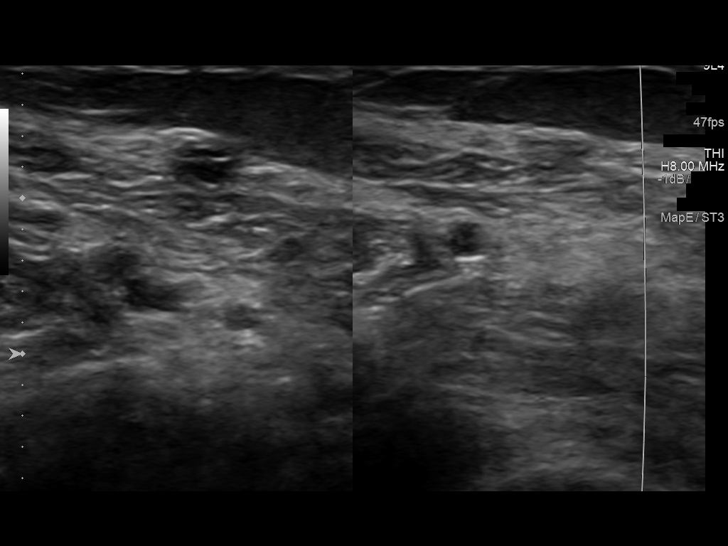
[im 32/35]
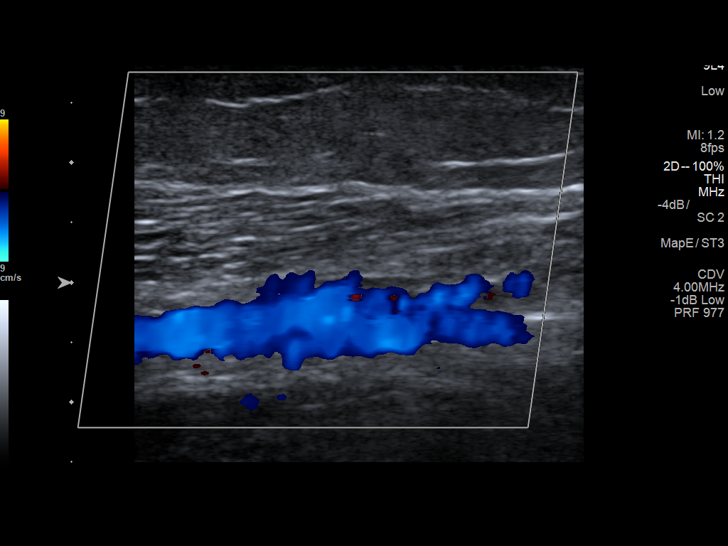
[im 35/35]
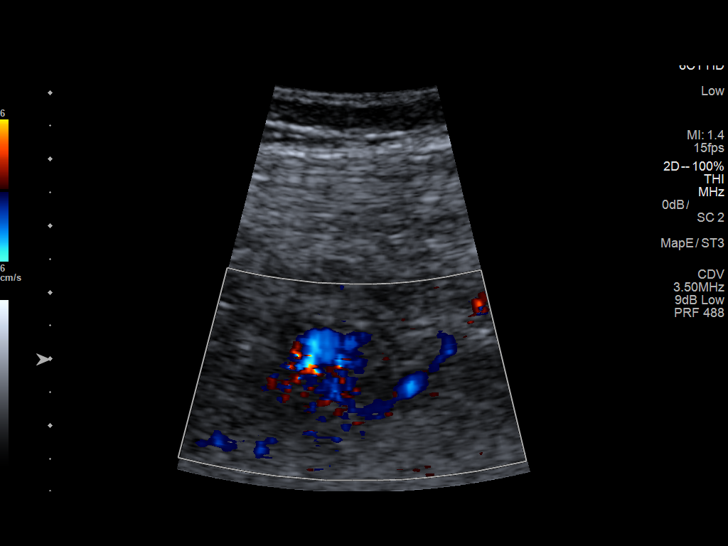

[13 of 24 positions shown; findings below may reference images not displayed]

FINDINGS: Contralateral Common Femoral Vein: Respiratory phasicity is normal
and symmetric with the symptomatic side. No evidence of thrombus.
Normal compressibility.

Common Femoral Vein: No evidence of thrombus. Normal
compressibility, respiratory phasicity and response to augmentation.

Saphenofemoral Junction: No evidence of thrombus. Normal
compressibility and flow on color Doppler imaging.

Profunda Femoral Vein: No evidence of thrombus. Normal
compressibility and flow on color Doppler imaging.

Femoral Vein: No evidence of thrombus. Normal compressibility,
respiratory phasicity and response to augmentation.

Popliteal Vein: No evidence of thrombus. Normal compressibility,
respiratory phasicity and response to augmentation.

Calf Veins: No evidence of thrombus. Normal compressibility and flow
on color Doppler imaging.

Superficial Great Saphenous Vein: No evidence of thrombus. Normal
compressibility and flow on color Doppler imaging.

Other Findings:  None.
IMPRESSION: Sonographic survey of the left lower extremity negative for DVT
